# Patient Record
Sex: Male | Born: 1960 | Race: White | Hispanic: No | Marital: Married | Smoking: Former smoker
Health system: Southern US, Community
[De-identification: ages and names within clinical notes are randomized; demographics above are authoritative.]

## PROBLEM LIST (undated history)

## (undated) DIAGNOSIS — G8929 Other chronic pain: Secondary | ICD-10-CM

## (undated) DIAGNOSIS — I10 Essential (primary) hypertension: Secondary | ICD-10-CM

## (undated) DIAGNOSIS — C801 Malignant (primary) neoplasm, unspecified: Secondary | ICD-10-CM

## (undated) DIAGNOSIS — E3400 Carcinoid syndrome, unspecified: Secondary | ICD-10-CM

## (undated) DIAGNOSIS — E7800 Pure hypercholesterolemia, unspecified: Secondary | ICD-10-CM

## (undated) DIAGNOSIS — R079 Chest pain, unspecified: Secondary | ICD-10-CM

## (undated) HISTORY — PX: WEDGE RESECTION: SHX5070

## (undated) HISTORY — DX: Malignant (primary) neoplasm, unspecified: C80.1

## (undated) HISTORY — DX: Pure hypercholesterolemia, unspecified: E78.00

## (undated) HISTORY — PX: HIGH DOSE RADIATION IMPLANT INSERTION: SHX6258

## (undated) HISTORY — PX: CHOLECYSTECTOMY: SHX55

## (undated) HISTORY — PX: OTHER SURGICAL HISTORY: SHX169

## (undated) HISTORY — DX: Essential (primary) hypertension: I10

## (undated) HISTORY — DX: Other chronic pain: G89.29

---

## 1998-07-16 ENCOUNTER — Ambulatory Visit (HOSPITAL_COMMUNITY): Admission: RE | Admit: 1998-07-16 | Discharge: 1998-07-16 | Payer: Self-pay | Admitting: Internal Medicine

## 1998-07-16 ENCOUNTER — Encounter: Payer: Self-pay | Admitting: Internal Medicine

## 2005-12-11 ENCOUNTER — Encounter: Admission: RE | Admit: 2005-12-11 | Discharge: 2005-12-11 | Payer: Self-pay | Admitting: Internal Medicine

## 2006-05-26 ENCOUNTER — Ambulatory Visit: Payer: Self-pay | Admitting: Cardiology

## 2006-05-26 ENCOUNTER — Inpatient Hospital Stay (HOSPITAL_COMMUNITY): Admission: EM | Admit: 2006-05-26 | Discharge: 2006-05-27 | Payer: Self-pay | Admitting: Emergency Medicine

## 2006-05-27 ENCOUNTER — Encounter: Payer: Self-pay | Admitting: Cardiology

## 2006-07-29 ENCOUNTER — Encounter: Admission: RE | Admit: 2006-07-29 | Discharge: 2006-07-29 | Payer: Self-pay | Admitting: Internal Medicine

## 2006-08-03 DIAGNOSIS — C801 Malignant (primary) neoplasm, unspecified: Secondary | ICD-10-CM

## 2006-08-03 HISTORY — DX: Malignant (primary) neoplasm, unspecified: C80.1

## 2006-08-06 ENCOUNTER — Encounter: Admission: RE | Admit: 2006-08-06 | Discharge: 2006-08-06 | Payer: Self-pay | Admitting: Internal Medicine

## 2006-08-25 ENCOUNTER — Ambulatory Visit (HOSPITAL_COMMUNITY): Admission: RE | Admit: 2006-08-25 | Discharge: 2006-08-25 | Payer: Self-pay | Admitting: Gastroenterology

## 2006-08-25 ENCOUNTER — Encounter (INDEPENDENT_AMBULATORY_CARE_PROVIDER_SITE_OTHER): Payer: Self-pay | Admitting: Specialist

## 2006-08-27 ENCOUNTER — Encounter: Admission: RE | Admit: 2006-08-27 | Discharge: 2006-08-27 | Payer: Self-pay | Admitting: Gastroenterology

## 2006-08-29 ENCOUNTER — Encounter: Admission: RE | Admit: 2006-08-29 | Discharge: 2006-08-29 | Payer: Self-pay | Admitting: Gastroenterology

## 2006-08-31 ENCOUNTER — Ambulatory Visit: Payer: Self-pay | Admitting: Oncology

## 2006-09-02 LAB — CBC WITH DIFFERENTIAL/PLATELET
BASO%: 0.6 % (ref 0.0–2.0)
Basophils Absolute: 0.1 10*3/uL (ref 0.0–0.1)
Eosinophils Absolute: 0.2 10*3/uL (ref 0.0–0.5)
LYMPH%: 27.8 % (ref 14.0–48.0)
MCH: 28.8 pg (ref 28.0–33.4)
MCHC: 34.6 g/dL (ref 32.0–35.9)
MCV: 83.2 fL (ref 81.6–98.0)
MONO#: 1 10*3/uL — ABNORMAL HIGH (ref 0.1–0.9)
NEUT#: 6 10*3/uL (ref 1.5–6.5)
NEUT%: 59.9 % (ref 40.0–75.0)
Platelets: 222 10*3/uL (ref 145–400)
RBC: 4.84 10*6/uL (ref 4.20–5.71)
lymph#: 2.8 10*3/uL (ref 0.9–3.3)

## 2006-09-03 ENCOUNTER — Ambulatory Visit (HOSPITAL_COMMUNITY): Admission: RE | Admit: 2006-09-03 | Discharge: 2006-09-03 | Payer: Self-pay | Admitting: Gastroenterology

## 2006-09-06 LAB — COMPREHENSIVE METABOLIC PANEL
AST: 20 U/L (ref 0–37)
Albumin: 4.4 g/dL (ref 3.5–5.2)
Alkaline Phosphatase: 59 U/L (ref 39–117)
CO2: 30 mEq/L (ref 19–32)
Calcium: 9.2 mg/dL (ref 8.4–10.5)
Chloride: 99 mEq/L (ref 96–112)
Potassium: 4 mEq/L (ref 3.5–5.3)
Total Bilirubin: 0.5 mg/dL (ref 0.3–1.2)

## 2006-09-06 LAB — CHROMOGRANIN A: Chromogranin A: 108 ng/mL (ref <160)

## 2006-09-20 LAB — 5 HIAA, QUANTITATIVE, URINE, 24 HOUR

## 2006-10-26 ENCOUNTER — Ambulatory Visit: Payer: Self-pay | Admitting: Oncology

## 2006-11-05 ENCOUNTER — Encounter: Admission: RE | Admit: 2006-11-05 | Discharge: 2006-11-05 | Payer: Self-pay | Admitting: Oncology

## 2006-12-21 ENCOUNTER — Ambulatory Visit: Payer: Self-pay | Admitting: Oncology

## 2007-03-04 ENCOUNTER — Ambulatory Visit: Payer: Self-pay | Admitting: Oncology

## 2007-05-19 ENCOUNTER — Ambulatory Visit: Payer: Self-pay | Admitting: Oncology

## 2007-05-19 LAB — BASIC METABOLIC PANEL: Chloride: 103 mEq/L (ref 96–112)

## 2007-08-15 ENCOUNTER — Ambulatory Visit: Payer: Self-pay | Admitting: Oncology

## 2007-08-15 LAB — BASIC METABOLIC PANEL
BUN: 15 mg/dL (ref 6–23)
Calcium: 9.2 mg/dL (ref 8.4–10.5)
Creatinine, Ser: 1 mg/dL (ref 0.40–1.50)
Glucose, Bld: 262 mg/dL — ABNORMAL HIGH (ref 70–99)

## 2007-11-15 ENCOUNTER — Ambulatory Visit: Payer: Self-pay | Admitting: Oncology

## 2008-02-21 ENCOUNTER — Ambulatory Visit: Payer: Self-pay | Admitting: Oncology

## 2008-03-21 IMAGING — CT CT PELVIS W/ CM
4 of 5 series · 12 of 46 positions shown, 18 images · IV contrast (READICAT/WATER & [ID] OMNI 300)
Comparison: Prior abdomen CT 08/06/06 and chest CT of 05/26/06.

CLINICAL DATA: Carcinoid syndrome.  Followup liver lesions. 
CHEST CT WITH CONTRAST:
TECHNIQUE: Multidetector CT imaging of the chest was performed following the standard protocol during bolus administration of intravenous contrast.
Contrast:  125 cc Omnipaque 300
TECHNIQUE: Multidetector CT imaging of the abdomen was performed following the standard protocol during bolus administration of intravenous contrast.
TECHNIQUE: Multidetector CT imaging of the pelvis was performed following the standard protocol during bolus administration of intravenous contrast.

[Series 3: chest/abd/pelvis · axial · 0.86mm/px · z∈[-581,-276]mm · 5 of 129 slices shown]
[im 16/129  soft-tissue]
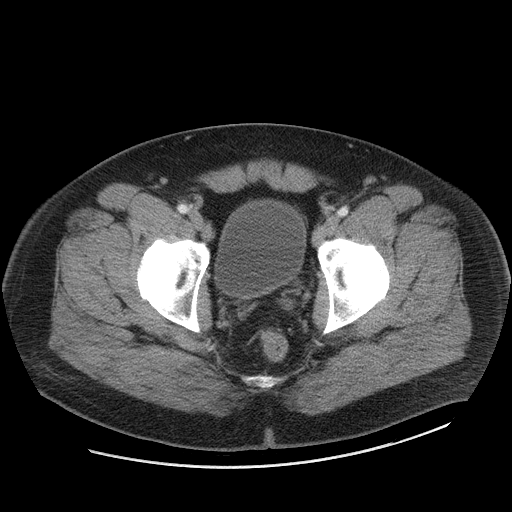
[im 31/129  soft-tissue]
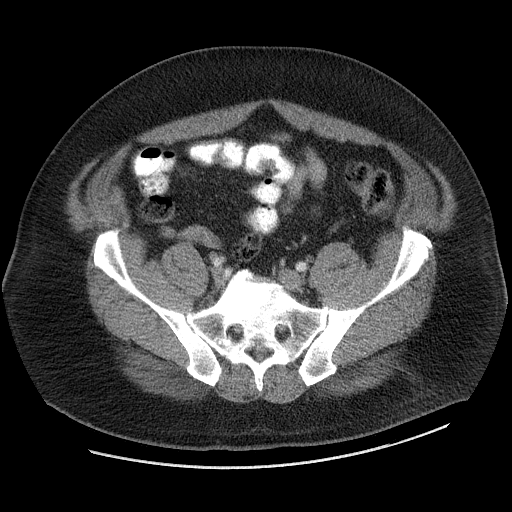
[im 46/129  soft-tissue]
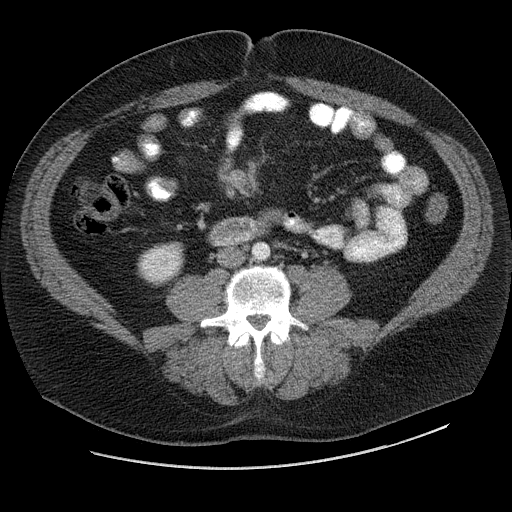
[im 61/129  soft-tissue]
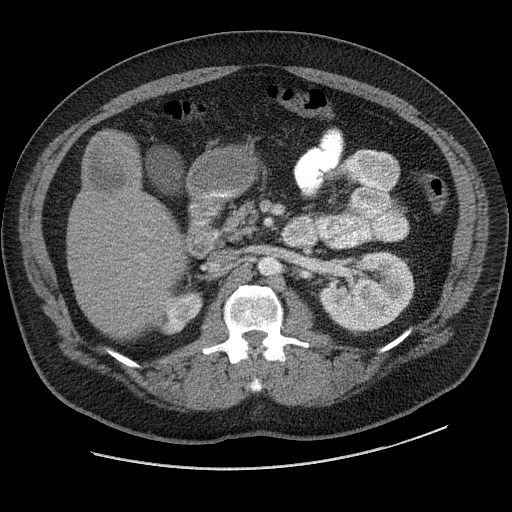
[im 76/129  soft-tissue]
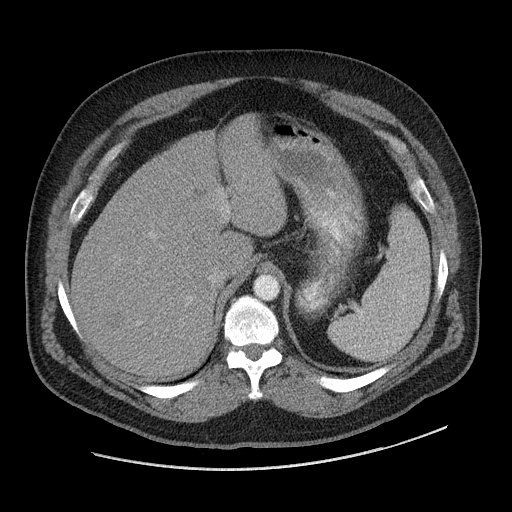

[Series 5: renal delay · axial · delayed · 0.86mm/px · z∈[-398,-318]mm · 3 of 33 slices shown, 7 images]
[im 9/33  soft-tissue]
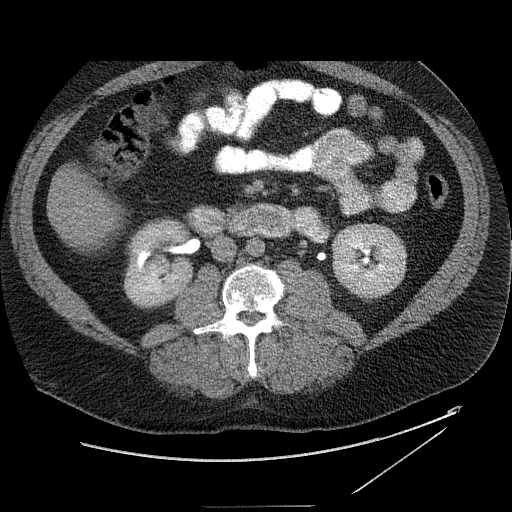
[im 9/33  lung]
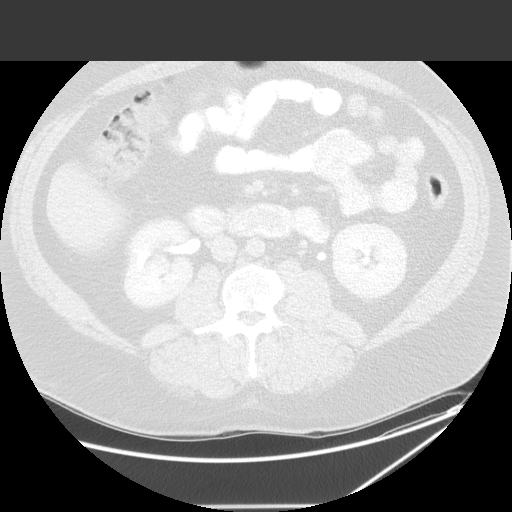
[im 9/33  bone]
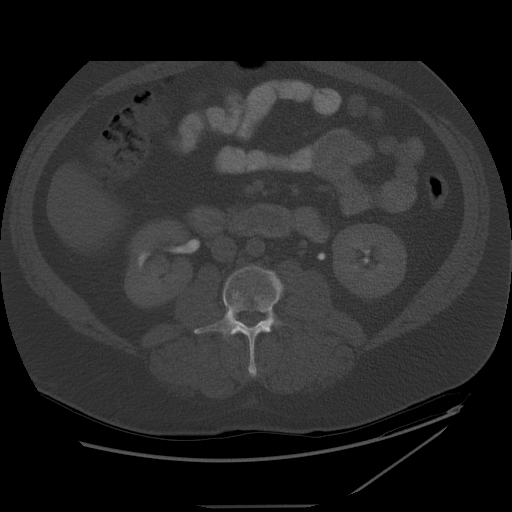
[im 17/33  soft-tissue]
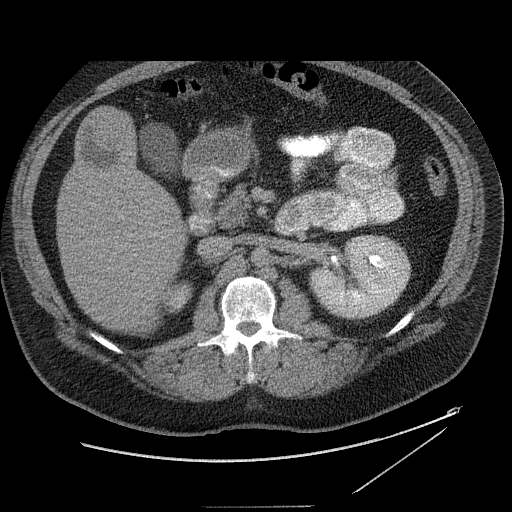
[im 17/33  lung]
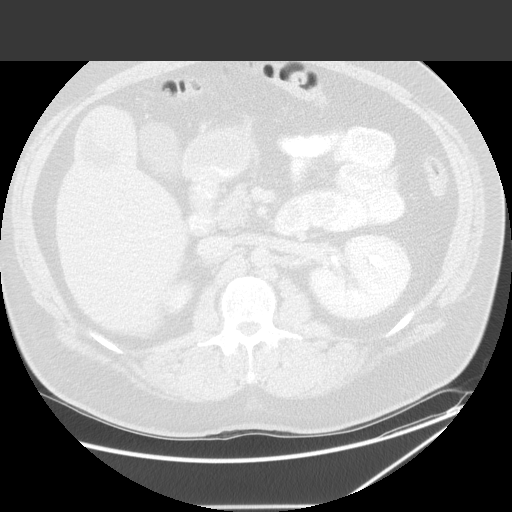
[im 25/33  soft-tissue]
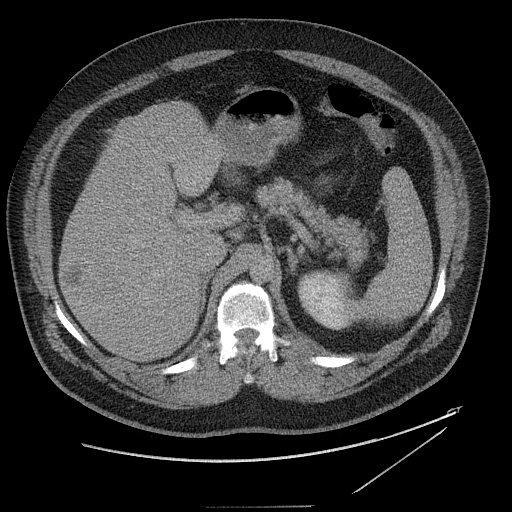
[im 25/33  lung]
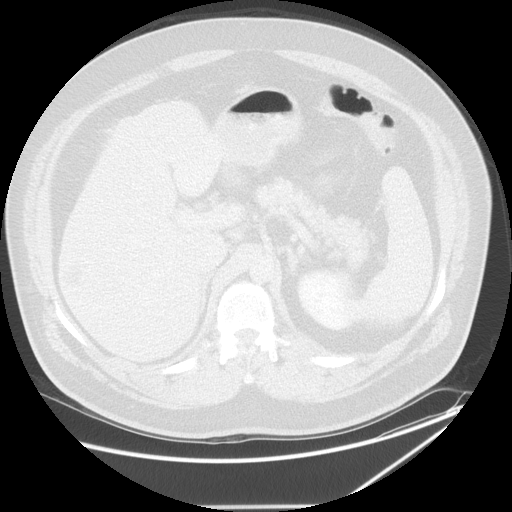

[Series 601: coronal body · coronal · 1.40mm/px · 1 of 149 slices shown, 2 images]
[im 50/149  soft-tissue]
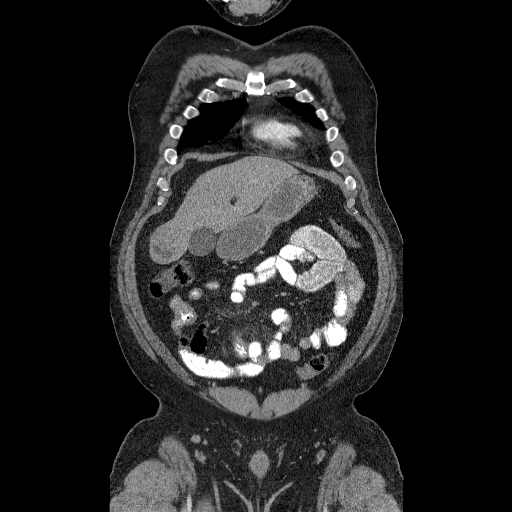
[im 50/149  bone]
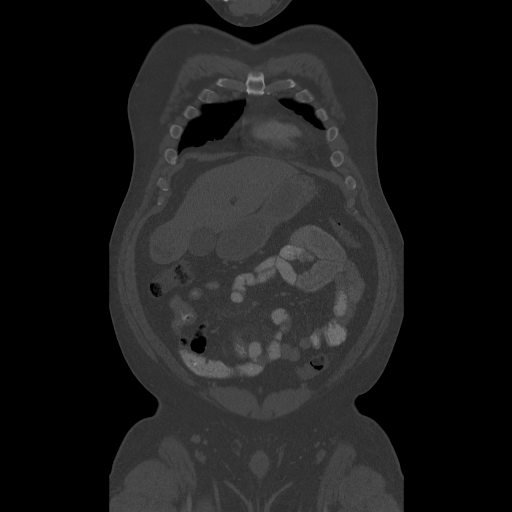

[Series 602: sagittal body · sagittal · 1.40mm/px · 3 of 177 slices shown, 4 images]
[im 59/177  soft-tissue]
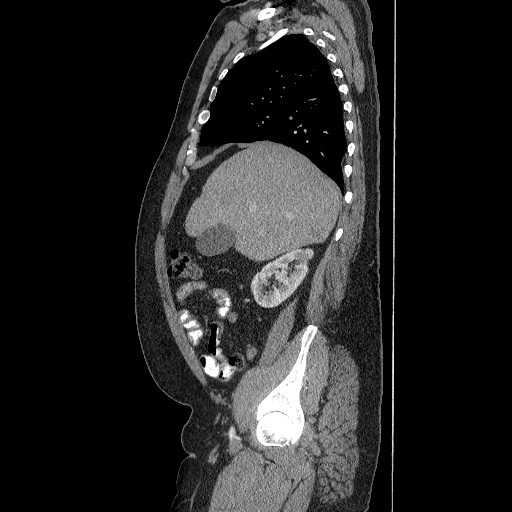
[im 79/177  soft-tissue]
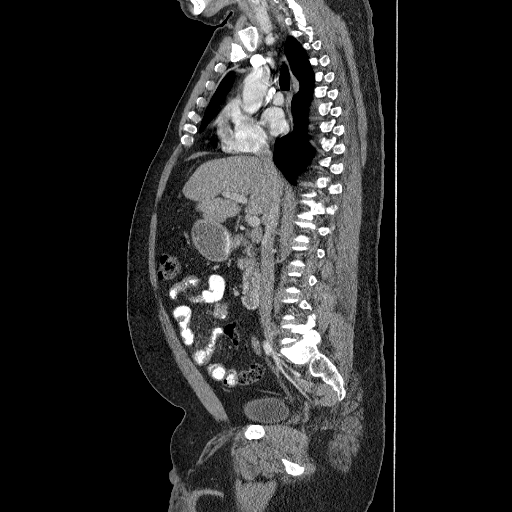
[im 79/177  bone]
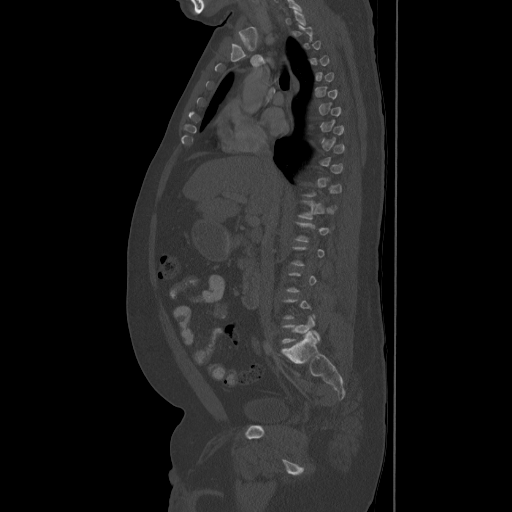
[im 98/177  soft-tissue]
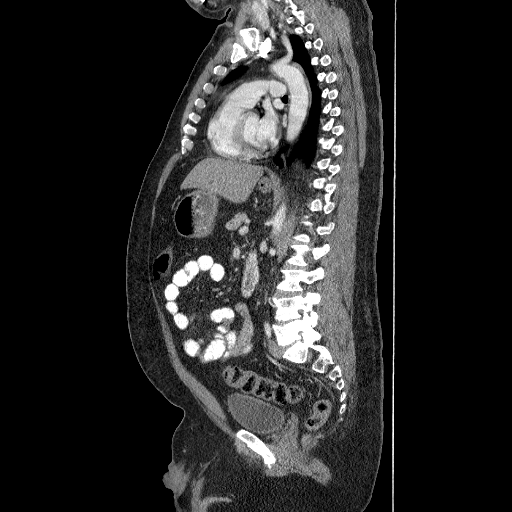

[12 of 46 positions shown; findings below may reference images not displayed]

FINDINGS: No new lung lesion is seen.  A calcified granuloma in the superior segment of left lower lobe is stable.  No effusion is seen.  A defect in the inferior end plate of T-6 vertebral body with surrounding sclerosis is unchanged compared to images from 05/26/06 and most likely benign.  No mediastinal or hilar adenopathy is seen.  The pulmonary arteries and thoracic aorta opacify with no acute abnormality.
IMPRESSION: Stable CT of the chest.  No metastatic involvement.  
ABDOMEN CT WITH CONTRAST:
FINDINGS: Compared to the CT abdomen of 08/06/06 several of the liver lesions may have increased in size.  The dominant lesion from the caudal aspect of the right lobe extending anteriorly has not changed measuring 53 x 69 mm.  However there does appear to be slight enlargement of a lesion in the posterior right lobe on image 66 measuring 20 x 19 mm measuring approximately 15 x 17 mm previously.  In addition, a lesion in the periphery of the right lobe on image #63 now measures 24 x 29 mm compared to 23 x 26 mm previously.  There are also several low attenuation lesions that are more prominent and possibly new,  suggestive of progressive liver metastases.  No ductal dilatation is seen.  No calcified gallstones are noted.  The pancreas, adrenal glands, and spleen are stable.  Hepatoduodenal nodes have not changed significantly.  Nodular mass in the midabdomen has remained relatively stable measuring 22 x 46 mm on image #88.  The abdominal aorta is normal in caliber.   The kidneys enhance normally and on delayed images the pelvocaliceal systems appear normal.
IMPRESSION: 1.  No change in lobular mass in the mid mesentery when compared to the study of 08/06/06. 
2.  Some of the liver lesions appear to be slightly more prominent with suggestion of new lesions worrisome for progression of liver metastases.  
PELVIS CT WITH CONTRAST:
FINDINGS: Urinary bladder is unremarkable.  Prostate is normal in size.  No colonic abnormality is seen.  The SI joints are somewhat irregular worrisome for sacroiliitis.  Apparent significant degenerative disc disease is noted at L5-S1.
IMPRESSION: 1.   No pelvic mass or adenopathy. 
2.  Significant degenerative disc disease at L5-S1.
3.  Question sacroiliitis.

## 2008-05-24 ENCOUNTER — Ambulatory Visit: Payer: Self-pay | Admitting: Oncology

## 2008-11-09 ENCOUNTER — Ambulatory Visit: Payer: Self-pay | Admitting: Oncology

## 2009-03-08 ENCOUNTER — Ambulatory Visit: Payer: Self-pay | Admitting: Oncology

## 2009-04-01 ENCOUNTER — Ambulatory Visit (HOSPITAL_BASED_OUTPATIENT_CLINIC_OR_DEPARTMENT_OTHER): Admission: RE | Admit: 2009-04-01 | Discharge: 2009-04-01 | Payer: Self-pay | Admitting: Urology

## 2009-09-18 ENCOUNTER — Ambulatory Visit: Payer: Self-pay | Admitting: Oncology

## 2010-01-14 ENCOUNTER — Ambulatory Visit: Payer: Self-pay | Admitting: Oncology

## 2010-04-15 ENCOUNTER — Ambulatory Visit: Payer: Self-pay | Admitting: Oncology

## 2010-07-23 ENCOUNTER — Ambulatory Visit: Payer: Self-pay | Admitting: Oncology

## 2010-08-24 ENCOUNTER — Encounter: Payer: Self-pay | Admitting: Oncology

## 2010-11-08 LAB — POCT I-STAT 4, (NA,K, GLUC, HGB,HCT)
Glucose, Bld: 164 mg/dL — ABNORMAL HIGH (ref 70–99)
Potassium: 4.1 mEq/L (ref 3.5–5.1)

## 2010-11-08 LAB — GLUCOSE, CAPILLARY: Glucose-Capillary: 145 mg/dL — ABNORMAL HIGH (ref 70–99)

## 2010-12-16 NOTE — Op Note (Signed)
NAME:  Frank Pennington, PAGLIA NO.:  [id number]   MEDICAL RECORD NO.:  [id number]          PATIENT TYPE:  AMB   LOCATION:  [facility]                         FACILITY:  [facility]   PHYSICIAN:  Sigmund I. Patsi Sears, M.D.DATE OF BIRTH:  06/21/1961   DATE OF PROCEDURE:  04/01/2009  DATE OF DISCHARGE:                               OPERATIVE REPORT   PREOPERATIVE DIAGNOSIS:  Distal urethral stricture.   POSTOPERATIVE DIAGNOSIS:  Distal urethral stricture.   OPERATION:  Cystourethroscopy, Cook balloon dilation of distal pendulous  urethral stricture.   SURGEON:  Sigmund I. Patsi Sears, M.D.   ANESTHESIA:  General LMA.   PREPARATION:  After appropriate preanesthesia, the patient is brought to  the operating room, placed on the operating table in dorsal supine  position where general LMA anesthesia was introduced.  He was then  replaced on dorsal lithotomy position where pubis was prepped with  Betadine solution and draped in usual fashion.   BRIEF HISTORY:  This 50 year old male has a history of malignant  carcinoid, metastatic to the liver, lymph nodes and lung.  The patient  has hematuria, was referred for evaluation where cystoscopy revealed a  dense distal urethral stricture, beginning in the submeatal area, and  traveling for unknown proximal distance.  The flexible cystoscope could  not be passed around the urethra.  The patient appeared to have a  secondary urethral passage, it is felt that he probably had a prior  instrumentation with epithelization of alternate urethral tract.   PROCEDURE:  Cystourethroscopy was accomplished, and submeatally, a dense  urethral stricture is identified.  Retrograde urethrogram shows a  stricture, and a guidewire was passed through the strictured area, and  manipulated with some difficulty through the urethra, into the bladder.  Urethral stricture appears to be approximately 3 cm long.  The Cook  balloon dilator was then passed  fluoroscopically through the urethra, to  the level of the prostatic urethra and bladder neck, and the entire  urethra is balloon dilated, with particular attention to the distal  urethral stricture.  Following 3-minute urethral dilation to 18  atmospheres pressure, repeat cystoscopy was accomplished and shows  hyperemic urethra, but otherwise normal urethra.  The tissue was intact.  The prostate has bilobar hypertrophy.  The  bladder itself shows normal trigone, with clear reflux of both orifices.  There was no evidence of bladder stone, tumor, or bladder diverticular  formation.  The urethral stricture was completely dilated.  The patient  is awakened, taken to recovery room in good condition.      Sigmund I. Patsi Sears, M.D.  Electronically Signed     SIT/MEDQ  D:  04/01/2009  T:  04/01/2009  Job:  [id number]   cc:   Merlene Laughter. Renae Gloss, M.D.  Fax: [phone]

## 2010-12-19 NOTE — Discharge Summary (Signed)
NAME:  Frank Pennington, Frank Pennington NO.:  [id number]   MEDICAL RECORD NO.:  [id number]          PATIENT TYPE:  INP   LOCATION:  3707                         FACILITY:  [facility]   PHYSICIAN:  Lonia Blood, M.D.       DATE OF BIRTH:  April 07, 1961   DATE OF ADMISSION:  05/26/2006  DATE OF DISCHARGE:  05/27/2006                                 DISCHARGE SUMMARY   DISCHARGE DIAGNOSES:  1. Chest pain, noncardiac in nature, resolved.  2. Gastroesophageal reflux disease.  3. Obesity.  4. Impaired glucose tolerance.  5. Mild hypertension.  6. Hyperlipidemia.  7. Tobacco abuse.  8. Incidentally noted low-density lesion on the liver; follow-up CT scan      recommended in 3 months.   DISCHARGE MEDICATIONS:  1. Lisinopril 10 mg daily.  2. Lipitor 20 mg at bedtime.  3. Prilosec OTC 20 mg daily.  4. Chantix 1 mg twice a day.  5. Hydrochlorothiazide 12.5 mg daily.   CONDITION ON DISCHARGE:  Frank Pennington was discharged in good condition.  At  time of discharge, he was instructed follow up with Dr. Diona Browner with  [facility] cardiology and to follow up with Dr. Barbee Shropshire for a followup CAT  scan of the liver.  At time of discharge, the patient was encouraged to  abstain from smoking cigarettes.   PROCEDURES DURING THIS ADMISSION:  1. On May 27, 2006, the patient underwent a cardiac catheterization by      Dr. Excell Seltzer which showed normal coronary arteries.  2. May 27, 2006, transthoracic echocardiogram that showed a normal      ejection fraction of 55 to 60% and no left ventricular regional wall      motion abnormalities, mild LVH.  3. May 26, 2006, computer tomography of the chest with intravenous      contrast that was negative for PE and a low-density lesion in the liver      was seen.  Follow-up CT scan of the liver recommended.   HISTORY AND PHYSICAL:  For admission history and physical, refer to the  dictated H&P done by Dr. Eda Paschal on May 26, 2006.   HOSPITAL COURSE.:  1. Chest pain.  Frank Pennington was admitted on the day of May 26, 2006      with complaints of chest pain which were a concern for typical angina.      The patient was admitted to telemetry unit and placed on metoprolol and      aspirin.  He also had cardiac enzymes checked three times.  He ruled      out for myocardial infarction, and he indeed did not have any      recurrence of his chest pain.  For completeness and given also the fact      the patient had tachycardia and shortness of breath at the time of      onset of chest pain, he had a computer tomography of the chest which      was negative for PE.  The patient was seen in consultation by the      cardiology service  and had a cardiac catheterization done which was      negative for any coronary artery disease.  2. Impaired glucose tolerance.  Frank Pennington fasting plasma glucose was      found to be elevated with levels of 107.  The patient had a hemoglobin      A1c of 6.5, and at this point in time, he is deemed to be having      impaired glucose tolerance.  The patient was counseled about a low-      concentrated carbohydrate diet and avoiding concentrated sweets.      Further follow-up with fasting plasma glucose to be done by the      patient's primary care physician.  3. Incidental finding of possible liver lesion.  The patient was informed      about the incidental finding.  At this point in time, liver profile was      obtained and was all within normal limits.  The significance of the      liver finding is unclear at this point in time, but recommended CT scan      of the abdomen with intravenous contrast was made by radiologist.  We      informed the patient about the finding on the liver, and Dr. Barbee Shropshire      can reschedule a CT scan with intravenous contrast of the liver for      this patient.  Also, the patient was encouraged to have a colonoscopy      which his primary care physician has strongly encouraged him to  do in      the past.  Of note, the patient did not have any degree of anemia      during this hospitalization with a measured hemoglobin of 14.2.      Lonia Blood, M.D.  Electronically Signed     SL/MEDQ  D:  05/27/2006  T:  05/29/2006  Job:  [id number]   cc:   Olene Craven, M.D.  Jonelle Sidle, MD

## 2010-12-19 NOTE — Cardiovascular Report (Signed)
NAME:  RIDGELY, ANASTACIO NO.:  [id number]   MEDICAL RECORD NO.:  [id number]          PATIENT TYPE:  INP   LOCATION:  3707                         FACILITY:  [facility]   PHYSICIAN:  Veverly Fells. Excell Seltzer, MD  DATE OF BIRTH:  06-14-1961   DATE OF PROCEDURE:  05/27/2006  DATE OF DISCHARGE:  05/27/2006                              CARDIAC CATHETERIZATION   PROCEDURE:  Left heart catheterization, selective coronary angiography, left  ventricular angiography, and Angio-Seal to the right femoral artery.   INDICATION:  Mr. Bogden is a very nice 50 year old gentleman who presented  to the emergency department with substernal chest pressure, worse with  exertion.  He has a history of tobacco abuse, dyslipidemia, and a very  strong family history of coronary artery disease.  With his suggestive  symptoms and cardiovascular risk factors, we elected to proceed with cardiac  catheterization.   PROCEDURAL DETAILS:  Risks and indications of the procedure were explained  in detail to the patient.  Informed consent was obtained.  The right groin  was prepped, draped, and anesthetized with 1% lidocaine under normal sterile  conditions.  Using the modified Seldinger technique, a 6-French arterial  sheath was placed in the right femoral artery.  Multiple angiographic views  of the left and right coronary arteries were taken.  For the left coronary  artery, a 6-french, JL-4 catheter was used.  For the right coronary artery,  a 6-french, JR-4 catheter was used.  Following selective coronary  angiography, a pigtail catheter was inserted into the left ventricle.  Left  ventricular pressures were recorded.  A 30-degree RAO left ventriculogram  was performed.  Pull back across the aortic valve was done.  All catheter  exchanges were performed over a guidewire.  At the conclusion of the case, a  6-French Angio-Seal was used to seal the arteriotomy site in the right  femoral artery.   FINDINGS:   Aortic pressure 115/79 with a mean of 96.  Left ventricular  pressure 114/16 with an end diastolic pressure of 21.   Coronary angiography:  The left main stem is angiographically normal.  It  bifurcates into the LAD and left circumflex.  The LAD is medium-caliber.  It  courses down to the distal anterior wall, but does not wrap around the apex.  It is a very small vessel in its most distal segment.  It gives off 2  diagonal branches, they are both angiographically normal.   The left circumflex is medium caliber vessel that gives off a very small  first obtuse marginal and a large second obtuse marginal that has multiple  branches.  The true left circumflex courses down the AV groove and is small  diameter.  The left circumflex system has no significant angiographic  disease.   The right coronary artery is dominant.  It is large caliber.  Distally, it  bifurcates into the PDA and posterior AV segment.  The PDA is a very large  vessel that extends all the way to the left ventricular apex.  The posterior  AV segment gives off 3 posterolateral branches.  One  of which is medium  caliber and the other 2 are small.  There is no significant coronary artery  disease in the right coronary artery distribution.   The left ventriculogram was performed on the 30 degree RAO projection.  The  left ventricular systolic function is borderline.  There is subtle global  hypokinesis with an estimated overall left ventricular ejection fraction of  50%.   ASSESSMENT:  1. Normal coronary arteries.  2. Borderline left ventricular function.   PLAN:  Mr. Chohan has been pain free and his coronary arteries are normal.  He should be okay for discharge home later today.  I suspect his chest pain  was noncardiac in origin.  To follow up his borderline left ventricular  function, we will perform an echocardiogram in approximately 2 weeks and  have him follow up with Dr. Diona Browner in the clinic in 2 to 4 weeks.   We will  not start him on any medical therapy at this time.      Veverly Fells. Excell Seltzer, MD  Electronically Signed     MDC/MEDQ  D:  05/27/2006  T:  05/28/2006  Job:  [id number]   cc:   Jonelle Sidle, MD  Olene Craven, M.D.

## 2010-12-19 NOTE — H&P (Signed)
NAME:  LOREN, SAWAYA               ACCOUNT NO.:  [id number]   MEDICAL RECORD NO.:  [id number]          PATIENT TYPE:  INP   LOCATION:  3707                         FACILITY:  [facility]   PHYSICIAN:  Hind I Elsaid, MD      DATE OF BIRTH:  1961-05-12   DATE OF ADMISSION:  05/26/2006  DATE OF DISCHARGE:                                HISTORY & PHYSICAL   MAIN COMPLAINT:  Chest pain for 1 day.   HISTORY OF PRESENT ILLNESS:  Mr. Sabedra is 50 gentleman, with a strong  family history of MI, history of chronic smoking, presented to the emergency  room around 2:00 a.m..  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  Chest pain, he admitted that the chest pain around 5, the severity  of pain 5/10, is a dull pain radiating to the shoulder and arm.  And  associated with sweating and nausea and shortness of breath.  Patient take a  little rest, he sat down and rested but the pain continued.  Also, the pain  continued the same down, did not resolve.  The severity of pain around  midnight, when he had to come to the emergency room.  According to the  patient, the pain responded to a nitroglycerin sublingual and is much better  now with a nitro drip.  Now, on interviewing the patient, the pain he  admitted is around 3 out of 10 and also localized with shoulder pain.  He  denies any shortness of breath at this moment and he denies any nausea.   PAST MEDICAL HISTORY:  History of hypertension, history of gastroesophageal  reflux disease, and hypercholesterolemia.   MEDICATIONS:  Home medication, he is on:  1. Lisinopril/HCTZ 1 tab p.o. daily.  2. Lipitor 40 mg p.o. q.h.s.  3. Chantix for quit smoking 1 mg p.o. b.i.d.   ALLERGIES:  No known drug allergies.   FAMILY HISTORY:  Is positive for MI on his dad's side.  His dad died at age  80.   History of COPD and hypertension on the mother's side.   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  He is in process of quitting smoking, now he is on 2 cigarettes  per day.  He started trying to quit smoking around 2 weeks ago.  He used to  smoke up to 2 packs per day for 20 years.  And he has no history of  alcoholism.  History of cocaine abuse.  He stopped like 9 months ago.   PHYSICAL EXAMINATION:  On examination of Mr. Herbers, vital signs: 50  97.9,  blood pressure 131/80, heart rate 81, respiratory rate 20, pulse oximetry  95% on room air.  GENERAL:  On general examination, Mr. Trebilcock is a moderately obese male  with normocephalic, atraumatic.  Pupil equally reactive to light and  accommodation.  No JVD and no thyromegaly.  No lymphadenopathy appreciated.  No peripheral cyanosis on his mouth.  The mucosa is moist, not dry.  His  mucosa is moist, tonsil dry, mucosa is moist and neck is short.  CARDIOVASCULAR EXAMINATION.  HEENT:  Is normocephalic, atraumatic.  Eyes, as I mentioned before.  NECK EXAMINATION:  Is short, no JVD and no thyromegaly.  CHEST EXAMINATION:  There is not any point of tenderness on pressing, that  means the pain is not producible by compression.  Patient can move his  shoulder, there is not any sign of shoulder, any rotator cuff tear or any  tendinitis.  EXAMINATION OF THE HEART:  Patient has S1 and S2.  I did not appreciate no  murmur, no gallops, and no rubs.  EXAMINATION OF THE LUNG:  Normal vesicular breathing.  Equal air entry and  breath sound is normal.  ABDOMINAL EXAMINATION:  Soft, non tender, bowel sound is normal.  No  organomegaly.  Symmetry of peripheral pulses intact.  Peripheral pulse  intact and there is no cyanosis, no peripheral cyanosis and there is no  ulcers.   LABORATORY DATA:  Patient on admission, sodium 141,  potassium 3.7, chloride  105, bi carb 28.5, chloride 16, BUN 16, creatinine 1.1, and glucose 118,  white blood cells 10.9, hemoglobin 14.9, hematocrit 43.6, and platelets 209.  Anion gap is 8, myoglobin is 54.8, CK MB 1.1, and troponin is negative x1.  Chest x-ray:  There is no active pulmonary disease and CT angiogram was done  to rule out PE, which there is no evidence of PE and there is bibasilar  atelectasis and there is 3 low density small density within the liver, the  largest size is 5.3 with mixed isodense and hyperdense exophytic lesion on  the anterior aspect of the right hepatic lobe about 5.3 cm.  EKG in the ER  was normal sinus rhythm at 78 beats per minute.   ASSESSMENT/PLAN:  1. Chest pain with shortness of breath.  Patient is a high risk patient      with a history of chronic smoking and positive family history of      coronary artery disease.  We can not rule out acute coronary syndrome      or coronary spasm.  Also, we can not rule out musculoskeletal pain,      although, examinations there is not any sign of rotator cuff tear or      any sign of any nerve compression.  Will admit the patient to tele for      close observation.  Will start the patient on aspirin 325 mg,      metoprolol 25 mg p.o. b.i.d.  CK MB will run a CK MB and troponin q.8      hours 24 hours.  Will order echo.  Cardiologist was consulted to clear      the patient to make sure there is no CHF here.  Will continue with      nitroglycerin 0.4 mg subcu p.r.n. for pain.  At the same, will continue      with a nitroglycerin drip as the patient admitted is helpful.      Morphine, also morphine 2 mg IV q.6 hours p.r.n. for pain.  2. CT finding of some liver exophytic masses around 5.3 cm.  It is      possible it could be a cyst or some masses.  We  may need to be able to      see this on ultrasound of the liver.  Will get ultrasound of the liver     after chest pain resolved.  Will get LFT's and alpha  fetoprotein  for      the time being.  3. Hypertension.  Will continue with Lisinopril and Metoprolol 25 mg p.o.      q.12.  4. Hyperlipidemia.  Will continue with Lipitor.  Will get lipid profile,      hemoglobin C1 and TSH and Lipitor 40 mg p.o. q.h.s.  5. And for his history of GERD.  Protonix 40 mg p.o. daily.  6. DVT and GI prophylaxis.      Hind Bosie Helper, MD  Electronically Signed     HIE/MEDQ  D:  05/26/2006  T:  05/27/2006  Job:  [id number]

## 2010-12-19 NOTE — Consult Note (Signed)
NAME:  Frank Pennington, Frank Pennington NO.:  [id number]   MEDICAL RECORD NO.:  [id number]          PATIENT TYPE:  INP   LOCATION:  3707                         FACILITY:  [facility]   PHYSICIAN:  Jonelle Sidle, MD DATE OF BIRTH:  [birth year]   DATE OF CONSULTATION:  05/26/2006  DATE OF DISCHARGE:                                   CONSULTATION   HISTORY OF PRESENT ILLNESS:  Frank Pennington is a 50 year old, white male who  presents to [facility] Emergency Room complaining of chest discomfort.  Initially evaluated and treated by [facility] who is admitting for further  evaluation.  We are asked to see secondary to chest discomfort.   Frank Pennington describes over the preceding several days a profound decrease  in energy and general fatigue.  Yesterday afternoon at approximately 4 p.m.  while changing his wife's tire, he first noticed some shortness of breath  followed by nausea, profound fatigue like he had been climbing a mountain  and diaphoresis.  He went inside and sat down to rest after he finished  changing the tire.  At that point, he developed left-sided anterior chest  dull pain radiating into his left arm.  He gave it a 5 on a scale of 0/10.  He did take some Alka-Seltzer which decreased the nausea, but did not  improve his other symptoms.  After relaxing, showering and attending a  meeting, during which time his discomfort maintained a 3 on a scale of 0/10  and his other symptoms had dissipated.  He went to bed around 10:30 p.m.  He  was unable to rest and was able to dose for about an hour and half after  taking some more Alka-Seltzer.  However, due to continuing symptoms, he  drove to the emergency room due to the persistence.  He denies prior  occurrences.  He feels the discomfort is worse at end expiration.  He denies  changes with movement, tenderness or changes with exertion.  Now at the time  of our evaluation, he states it is a 2-3 on a scale of 0/10.  The last time  it  was a 0 was before onset at 1600 hours yesterday.   ALLERGIES:  No known drug allergies.   MEDICATIONS PRIOR TO ADMISSION:  1. Chantix 1 mg b.i.d.  2. Lipitor 20 mg daily.  3. Lisinopril/hydrochlorothiazide 10/12.5 mg daily.   PAST MEDICAL HISTORY:  1. Hypertension.  He rarely checks his blood pressure at home.  However,      when he does, it ranges between 100-130s systolic and 60-80s diastolic.  2. History of hyperlipidemia.  Last time it was checked was approximately      6 months ago.  He does not know his numbers.  Lipitor was started      approximately a year ago.  3. GERD.  4. He was evaluated in [place] for palpitations approximately 10 years      ago.  At that time, a stress test was performed and he was told it was      okay.   PAST SURGICAL HISTORY:  He  denies any surgical history.   SOCIAL HISTORY:  He resides in [place] with his wife.  He has one biological  child and no grandchildren.  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  He  smoked one and a half packs a day x20 years.  He is down to two to three  cigarettes per day and denies any alcohol.  He has not used cocaine in 9  months after attending a recovery program at [facility] from August 19, 2005, to September 16, 2005.  He does not exercise.  He tries to avoid  fried foods and sodas.  Denies herbal medications.   FAMILY HISTORY:  Mother is alive in her 23s with a history of COPD, CHF,  hypertension and hypothyroidism.  Father died at the age of 80 during  anesthesia for cataract surgery.  His father had his first myocardial  infarction in his 25s and was status bypass surgery.  His wife seems to  think his ejection fraction was approximately 8%.  He has a brother and  sister that are alive and well.   REVIEW OF SYSTEMS:  CONSTITUTIONAL:  Notable for 40 pound weight gain over  the last 9 months, occasional sinus congestion, improved smoker's cough,  positive snoring, however, wife denies OSA.   GENITOURINARY:  Urinary  frequency and urgency which he was going to discuss with Dr. Barbee Shropshire at his  next checkup.  Bright red per rectum occasionally secondary to hemorrhoids.  He denies any history of diabetes, myocardial infarction, CVA, COPD,  bleeding, dyscrasia or thyroid dysfunction.   CURRENT MEDICATIONS:  1. Metoprolol 25 mg b.i.d.  2. Lisinopril 20 b.i.d.  3. Lipitor 40 nightly.  4. Sublingual nitroglycerin p.r.n.   PHYSICAL EXAMINATION:  VITAL SIGNS:  Temperature is 97.9, blood pressure  131/80, now 108/71, pulse 81, respirations 20, 95% saturations on room air.  HEENT:  Grossly unremarkable.  NECK:  Supple without thyromegaly, adenopathy, JVD or carotid bruits.  CHEST:  Symmetrical excursion.  LUNGS:  Sounds were essentially clear, but decreased breath sounds with some  inspiratory wheezing.  HEART:  PMI is not displaced.  Regular rate and rhythm.  I did not  appreciate any murmurs, rubs, clicks or gallops.  All pulses are symmetrical  and intact without abdominal or femoral bruits.  SKIN:  Integument intact.  ABDOMEN:  Obese.  Bowel sounds present without organomegaly, masses or  tenderness.  EXTREMITIES:  No cyanosis, clubbing or edema.  MUSCULOSKELETAL:  Unremarkable.  He does have some chest wall tenderness,  but this is not reproducible of his symptoms.  NEUROLOGIC:  Grossly  unremarkable.   LABORATORY DATA AND X-RAY FINDINGS:  Chest x-ray with no active disease.  Chest CT was negative for pulmonary embolism, bibasilar atelectasis.  He had  several low-density liver lesions with recommend followup CT.  EKG shows  normal sinus rhythm, normal intervals with a ventricular rate of 78, normal  axis, no acute changes.  Old EKGs are not available for comparison.  H&H is  14.9 an 43.6, normal indices, platelets 209, WBCs 10.9.  Sodium 141,  potassium 3.7, glucose 118, BUN 16, D-dimer was elevated at 0.48.  Point of care markers were within normal limits x3.    IMPRESSION:  1. Prolonged chest discomfort since 1600 hours yesterday of uncertain      etiology associated with a normal EKG and point of care markers.      Elevated D-dimer with a negative chest CT for pulmonary embolism.  2.  Hypertension.  3. Obesity.  4. Tobacco use, but is in the process of quitting.  5. Remote cocaine use.  6. Obesity.  7. Incidentally noted low-density liver lesions by CT scan.  Follow-up      scanning suggested (see radiology report).  I would anticipate that the      primary admitting service relay this information and follow-up plans to      the patient's primary care physician.   RECOMMENDATIONS:  Dr. Diona Browner reviewed the patient's history, spoke with  and examined the patient and agrees with the above.  We will continue his  current medications, although we will add an aspirin to his medical regimen  and place him on IV heparin for now.  We will also check CK total, MBs, and  troponins q.8 h. x3 as well as obtaining a PT/PTT, hemoglobin A1c given his  high glucose.  We will also check a CMET.  Dr. Diona Browner discussed further  diagnostic options with the patient (noninvasive versus invasive) and  reviewed the potential risks and benefits of stress Myoview and of cardiac  catheterization.  After discussing this, the patient agrees to proceed with  a cardiac catheterization.  Will arrange.  We will keep the patient n.p.o.  for now in case he can proceed today.  Further treatment plan pending.     ______________________________  Joellyn Rued, PA-C      Jonelle Sidle, MD  Electronically Signed    EW/MEDQ  D:  05/26/2006  T:  05/27/2006  Job:  [id number]   cc:   Olene Craven, M.D.

## 2011-01-23 ENCOUNTER — Encounter (HOSPITAL_BASED_OUTPATIENT_CLINIC_OR_DEPARTMENT_OTHER): Payer: BC Managed Care – PPO | Admitting: Oncology

## 2011-01-23 DIAGNOSIS — E340 Carcinoid syndrome: Secondary | ICD-10-CM

## 2011-01-23 DIAGNOSIS — C787 Secondary malignant neoplasm of liver and intrahepatic bile duct: Secondary | ICD-10-CM

## 2011-01-23 DIAGNOSIS — C969 Malignant neoplasm of lymphoid, hematopoietic and related tissue, unspecified: Secondary | ICD-10-CM

## 2011-06-29 ENCOUNTER — Other Ambulatory Visit: Payer: Self-pay | Admitting: *Deleted

## 2011-06-29 ENCOUNTER — Encounter: Payer: Self-pay | Admitting: *Deleted

## 2011-06-29 DIAGNOSIS — E340 Carcinoid syndrome: Secondary | ICD-10-CM

## 2011-06-29 MED ORDER — OXYCODONE HCL 5 MG PO TABS: 5.0000 mg | ORAL_TABLET | ORAL | Status: DC | PRN

## 2011-06-29 MED ORDER — MORPHINE SULFATE CR 30 MG PO TB12: 30.0000 mg | ORAL_TABLET | Freq: Two times a day (BID) | ORAL | Status: DC

## 2011-06-29 NOTE — Telephone Encounter (Signed)
Left VM needing to pick up refills on pain meds.

## 2011-06-29 NOTE — Telephone Encounter (Signed)
Request to pick up scripts for pain meds

## 2011-08-13 ENCOUNTER — Other Ambulatory Visit: Payer: Self-pay | Admitting: *Deleted

## 2011-08-13 DIAGNOSIS — E340 Carcinoid syndrome: Secondary | ICD-10-CM

## 2011-08-13 MED ORDER — MORPHINE SULFATE CR 30 MG PO TB12: 30.0000 mg | ORAL_TABLET | Freq: Two times a day (BID) | ORAL | Status: DC

## 2011-08-13 MED ORDER — OXYCODONE HCL 5 MG PO TABS: 5.0000 mg | ORAL_TABLET | ORAL | Status: DC | PRN

## 2011-08-13 NOTE — Telephone Encounter (Signed)
Call from pt requesting refill on pain medications. Reviewed with Dr. Truett Perna. Rx left at front desk.

## 2011-09-10 ENCOUNTER — Other Ambulatory Visit: Payer: Self-pay | Admitting: *Deleted

## 2011-09-10 ENCOUNTER — Telehealth: Payer: Self-pay | Admitting: *Deleted

## 2011-09-10 ENCOUNTER — Telehealth: Payer: Self-pay | Admitting: Oncology

## 2011-09-10 NOTE — Telephone Encounter (Signed)
called pt lmovm for appt on 02/19. asked pt to rtn call to confirm

## 2011-09-10 NOTE — Telephone Encounter (Signed)
Message from pt requesting follow up appt with Dr. Truett Perna. Returned call, left message on voicemail for pt to call office and leave RN a message as to whether or not he is having an issue currently.

## 2011-09-12 ENCOUNTER — Telehealth: Payer: Self-pay | Admitting: Oncology

## 2011-09-12 NOTE — Telephone Encounter (Signed)
pt rtn call to confirm appt for 02/19

## 2011-09-21 ENCOUNTER — Other Ambulatory Visit: Payer: Self-pay | Admitting: *Deleted

## 2011-09-21 NOTE — Telephone Encounter (Signed)
Called patient to follow up on his refill request. Reminded him he has appointment with MD tomorrow. He will get refills at that time.

## 2011-09-21 NOTE — Telephone Encounter (Signed)
Wife called for pain med refills for patient.

## 2011-09-22 ENCOUNTER — Encounter: Payer: Self-pay | Admitting: Oncology

## 2011-09-22 ENCOUNTER — Other Ambulatory Visit: Payer: Self-pay | Admitting: *Deleted

## 2011-09-22 ENCOUNTER — Ambulatory Visit (HOSPITAL_BASED_OUTPATIENT_CLINIC_OR_DEPARTMENT_OTHER): Payer: BC Managed Care – PPO | Admitting: Oncology

## 2011-09-22 VITALS — BP 139/96 | HR 90 | Temp 98.4°F | Ht 71.5 in | Wt 260.7 lb

## 2011-09-22 DIAGNOSIS — E340 Carcinoid syndrome: Secondary | ICD-10-CM

## 2011-09-22 DIAGNOSIS — D3A00 Benign carcinoid tumor of unspecified site: Secondary | ICD-10-CM

## 2011-09-22 DIAGNOSIS — C787 Secondary malignant neoplasm of liver and intrahepatic bile duct: Secondary | ICD-10-CM

## 2011-09-22 DIAGNOSIS — Z23 Encounter for immunization: Secondary | ICD-10-CM

## 2011-09-22 MED ORDER — OXYCODONE HCL 5 MG PO TABS: 5.0000 mg | ORAL_TABLET | ORAL | Status: DC | PRN

## 2011-09-22 MED ORDER — MORPHINE SULFATE CR 30 MG PO TB12: 30.0000 mg | ORAL_TABLET | Freq: Two times a day (BID) | ORAL | Status: DC

## 2011-09-22 MED ORDER — INFLUENZA VIRUS VACC SPLIT PF IM SUSP
0.5000 mL | Freq: Once | INTRAMUSCULAR | Status: AC
  Administered 2011-09-22: 0.5 mL via INTRAMUSCULAR
  Filled 2011-09-22: qty 0.5

## 2011-09-22 NOTE — Progress Notes (Signed)
OFFICE PROGRESS NOTE   INTERVAL HISTORY:   He returns as scheduled. He continues close clinical followup with Dr.Morse. He was treated with drug eluting beads (doxorubicin) in November of 2012. A followup CT scan on 09/07/2011 revealed an increase in the size of several liver lesions. Others have decreased. The calcified mesenteric mass was slightly larger. He is now followed with an observation approach. He does not have diarrhea. He continues monthly Sandostatin. The pain is under good control with MS Contin. He takes a rare oxycodone tablet for breakthrough pain. He is having difficulty controlling his blood sugar. He is now maintained on Levemir.  Objective:  Vital signs in last 24 hours:  Blood pressure 139/96, pulse 90, temperature 98.4 F (36.9 C), temperature source Oral, height 5' 11.5" (1.816 m), weight 260 lb 11.2 oz (118.253 kg).   Resp: Lungs clear bilaterally Cardio: Regular rate and rhythm, no murmur GI: There is fullness in the right abdomen. No tenderness. I cannot palpate a discrete liver edge. Vascular: No leg edema    Medications: I have reviewed the patient's current medications.  Assessment/Plan: 1. Metastatic carcinoid tumor, status post treatment at [facility] on [facility] study with progressive disease noted on a restaging CT 06/25/2008.  He is status post SIR-Spheres therapy at [facility] in December 2009.  A restaging CT 03/22/2009 confirmed a decrease in the metastatic liver lesions compared to the November 2009 CT.   a. Restaging CT 09/04/2009 at [facility] confirmed progressive disease in the liver. b. Initiation of sorafenib therapy February 2011.  c. Restaging CT June 2011 with progressive disease in the liver. d. Initiation of Avastin/Xeloda June 2011  e. Initiation of Nexavar 08/14/2010 after a CT of the chest, abdomen and pelvis confirmed enlargement of liver lesions and a mesenteric mass. f. Restaging CT 04/14/2011 showed enlargement hepatic masses and a minimal  enlargement of the mesenteric mass g. Right hepatic artery embolization with drug-eluting beads 05/15/2011 h. Left lateral hepatic artery embolization with drug-eluting beads (doxorubicin) 06/15/2011 i. Restaging CT 09/07/2011 showed an increase in the size of several hepatic lesion and a decrease in other lesions. The mesenteric mass was slightly larger the 2. Carcinoid syndrome, controlled with monthly Sandostatin. 3. Pain secondary to metastatic disease involving the liver, controlled with MS Contin/oxycodone. 4. Hypertension. 5. Hypercholesterolemia, recently started on niacin. 6. Facial flushing and a "hot" sensation, potentially related to niacin. 7. History of Helicobacter pylori gastritis. 8. History of rectal bleeding. 9. Diabetes-now on insulin 10. Lumbar osteomyelitis in December 2008. 11. Status post resection of a right lung lesion in April 2010 with pathology revealing only a microscopic focus of carcinoid tumor in the submitted tissue.     Disposition:  He is now followed with an observation approach after a restaging CT evaluation 09/07/2011 revealed a mixed response to the drug eluting beads. He will see Dr.Morse in May for a restaging evaluation. He continues monthly Sandostatin.  He continues MS Contin and oxycodone for pain. We administered an influenza vaccine today.  He will return for an office visit here in 6 months. We will see him in the interim as needed.   Lucile Shutters, MD  09/22/2011  4:39 PM

## 2011-10-20 ENCOUNTER — Other Ambulatory Visit: Payer: Self-pay | Admitting: *Deleted

## 2011-10-20 DIAGNOSIS — E340 Carcinoid syndrome: Secondary | ICD-10-CM

## 2011-10-20 MED ORDER — OXYCODONE HCL 5 MG PO TABS: 5.0000 mg | ORAL_TABLET | ORAL | Status: DC | PRN

## 2011-10-20 MED ORDER — MORPHINE SULFATE CR 30 MG PO TB12: 30.0000 mg | ORAL_TABLET | Freq: Two times a day (BID) | ORAL | Status: DC

## 2011-10-20 NOTE — Telephone Encounter (Signed)
Notified patient that scripts are ready for pick up 

## 2011-11-16 ENCOUNTER — Telehealth: Payer: Self-pay | Admitting: *Deleted

## 2011-11-17 NOTE — Telephone Encounter (Signed)
Called patient back and discussed need for handicap placard with [employer]. He reports he is unable to walk 200 feet without stopping to rest. MD signed application and mailed to patient's home.

## 2011-12-01 ENCOUNTER — Other Ambulatory Visit: Payer: Self-pay | Admitting: *Deleted

## 2011-12-01 DIAGNOSIS — E340 Carcinoid syndrome: Secondary | ICD-10-CM

## 2011-12-01 MED ORDER — MORPHINE SULFATE CR 30 MG PO TB12: 30.0000 mg | ORAL_TABLET | Freq: Two times a day (BID) | ORAL | Status: DC

## 2011-12-01 MED ORDER — OXYCODONE HCL 5 MG PO TABS: 5.0000 mg | ORAL_TABLET | ORAL | Status: DC | PRN

## 2011-12-01 NOTE — Telephone Encounter (Signed)
Notified scripts are ready.

## 2012-01-07 ENCOUNTER — Other Ambulatory Visit: Payer: Self-pay | Admitting: *Deleted

## 2012-01-07 DIAGNOSIS — E340 Carcinoid syndrome: Secondary | ICD-10-CM

## 2012-01-07 MED ORDER — MORPHINE SULFATE ER 30 MG PO TBCR: 30.0000 mg | EXTENDED_RELEASE_TABLET | Freq: Two times a day (BID) | ORAL | Status: DC

## 2012-01-07 MED ORDER — OXYCODONE HCL 5 MG PO TABS: 5.0000 mg | ORAL_TABLET | ORAL | Status: DC | PRN

## 2012-02-09 ENCOUNTER — Other Ambulatory Visit: Payer: Self-pay | Admitting: *Deleted

## 2012-02-09 DIAGNOSIS — E340 Carcinoid syndrome: Secondary | ICD-10-CM

## 2012-02-09 MED ORDER — MORPHINE SULFATE ER 30 MG PO TBCR: 30.0000 mg | EXTENDED_RELEASE_TABLET | Freq: Two times a day (BID) | ORAL | Status: DC

## 2012-02-09 MED ORDER — OXYCODONE HCL 5 MG PO TABS: 5.0000 mg | ORAL_TABLET | ORAL | Status: DC | PRN

## 2012-03-08 ENCOUNTER — Telehealth: Payer: Self-pay | Admitting: *Deleted

## 2012-03-08 ENCOUNTER — Other Ambulatory Visit: Payer: Self-pay | Admitting: *Deleted

## 2012-03-08 ENCOUNTER — Ambulatory Visit (HOSPITAL_BASED_OUTPATIENT_CLINIC_OR_DEPARTMENT_OTHER): Payer: BC Managed Care – PPO | Admitting: Oncology

## 2012-03-08 VITALS — BP 128/91 | HR 91 | Temp 97.7°F | Resp 20 | Ht 71.5 in | Wt 269.7 lb

## 2012-03-08 DIAGNOSIS — R599 Enlarged lymph nodes, unspecified: Secondary | ICD-10-CM

## 2012-03-08 DIAGNOSIS — C7A00 Malignant carcinoid tumor of unspecified site: Secondary | ICD-10-CM

## 2012-03-08 DIAGNOSIS — E340 Carcinoid syndrome: Secondary | ICD-10-CM

## 2012-03-08 DIAGNOSIS — C787 Secondary malignant neoplasm of liver and intrahepatic bile duct: Secondary | ICD-10-CM

## 2012-03-08 DIAGNOSIS — E119 Type 2 diabetes mellitus without complications: Secondary | ICD-10-CM

## 2012-03-08 MED ORDER — OXYCODONE HCL 5 MG PO TABS: 5.0000 mg | ORAL_TABLET | ORAL | Status: DC | PRN

## 2012-03-08 MED ORDER — MORPHINE SULFATE ER 30 MG PO TBCR: 30.0000 mg | EXTENDED_RELEASE_TABLET | Freq: Two times a day (BID) | ORAL | Status: DC

## 2012-03-08 NOTE — Progress Notes (Signed)
   [facility]    OFFICE PROGRESS NOTE   INTERVAL HISTORY:   He returns as scheduled. He continues monthly Sandostatin. He reports 2-3 loose stools per day. The abdominal pain is controlled with MS Contin once daily and twice daily oxycodone. He has a nodule at the left side of the nose.  He underwent a restaging CT at [facility] in May and stable hepatic metastases were noted.  Objective:  Vital signs in last 24 hours:  Blood pressure 128/91, pulse 91, temperature 97.7 F (36.5 C), temperature source Oral, resp. rate 20, height 5' 11.5" (1.816 m), weight 269 lb 11.2 oz (122.335 kg).    HEENT: Neck without mass, there is a 1 cm cutaneous firm cystic lesion adjacent to the left nostril. Lymphatics: No cervical or supraclavicular nodes Resp: End inspiratory rhonchi/wheezing at the right greater than left upper and lower chest, good air movement bilaterally, no respiratory distress Cardio: Regular rate and rhythm GI: No splenomegaly. There is fullness in the right mid abdomen without a discrete liver edge Vascular: No leg edema    Medications: I have reviewed the patient's current medications.  Assessment/Plan: 1. Metastatic carcinoid tumor, status post treatment at [facility] on [facility] study with progressive disease noted on a restaging CT 06/25/2008. He is status post SIR-Spheres therapy at [facility] in December 2009. A restaging CT 03/22/2009 confirmed a decrease in the metastatic liver lesions compared to the November 2009 CT.  a. Restaging CT 09/04/2009 at [facility] confirmed progressive disease in the liver. b. Initiation of sorafenib therapy February 2011.  c. Restaging CT June 2011 with progressive disease in the liver. d. Initiation of Avastin/Xeloda June 2011  e. Initiation of Nexavar 08/14/2010 after a CT of the chest, abdomen and pelvis confirmed enlargement of liver lesions and a mesenteric mass. f. Restaging CT 04/14/2011 showed enlargement hepatic masses and a minimal  enlargement of the mesenteric mass g. Right hepatic artery embolization with drug-eluting beads 05/15/2011 h. Left lateral hepatic artery embolization with drug-eluting beads (doxorubicin) 06/15/2011 i. Restaging CT 09/07/2011 showed an increase in the size of several hepatic lesion and a decrease in other lesions. The mesenteric mass was slightly larger j. Restaging CT at [facility] on 12/07/2011 reveals stable hepatic metastases 2. Carcinoid syndrome, controlled with monthly Sandostatin. 3. Pain secondary to metastatic disease involving the liver, controlled with MS Contin/oxycodone. 4. Hypertension. 5. Hypercholesterolemia, recently started on niacin. 6. History of Facial flushing and a "hot" sensation, potentially related to niacin. 7. History of Helicobacter pylori gastritis. 8. History of rectal bleeding. 9. Diabetes-now on insulin 10. Lumbar osteomyelitis in December 2008. 11. Status post resection of a right lung lesion in April 2010 with pathology revealing only a microscopic focus of carcinoid tumor in the submitted tissue.  12. Nodular cutaneous lesion adjacent to the left nostril-likely a benign cyst, he plans to see a dermatologist   Disposition:  He appears stable with regard to the metastatic carcinoid tumor. He continues monthly Sandostatin and is scheduled for a restaging CT at [facility] in November. He will return for an office visit here after the restaging CT.   Thornton Papas, MD  03/08/2012  5:12 PM

## 2012-03-08 NOTE — Telephone Encounter (Signed)
Gave patient appointment for 06-20-2012 at 3:45pm

## 2012-04-11 ENCOUNTER — Other Ambulatory Visit: Payer: Self-pay | Admitting: *Deleted

## 2012-04-11 DIAGNOSIS — E340 Carcinoid syndrome: Secondary | ICD-10-CM

## 2012-04-11 MED ORDER — MORPHINE SULFATE ER 30 MG PO TBCR: 30.0000 mg | EXTENDED_RELEASE_TABLET | Freq: Two times a day (BID) | ORAL | Status: DC

## 2012-04-11 MED ORDER — OXYCODONE HCL 5 MG PO TABS: 5.0000 mg | ORAL_TABLET | ORAL | Status: DC | PRN

## 2012-04-11 NOTE — Telephone Encounter (Signed)
Received call from pt requesting refill on MS Contin and Oxycodone.  Called and spoke with pt. Informing him that prescriptions are ready for pick-up at [facility].  Pt verbalized understanding.

## 2012-05-11 ENCOUNTER — Telehealth: Payer: Self-pay | Admitting: *Deleted

## 2012-05-11 DIAGNOSIS — E340 Carcinoid syndrome: Secondary | ICD-10-CM

## 2012-05-11 MED ORDER — OXYCODONE HCL 5 MG PO TABS: 5.0000 mg | ORAL_TABLET | ORAL | Status: DC | PRN

## 2012-05-11 MED ORDER — MORPHINE SULFATE ER 30 MG PO TBCR: 30.0000 mg | EXTENDED_RELEASE_TABLET | Freq: Two times a day (BID) | ORAL | Status: DC

## 2012-05-11 NOTE — Telephone Encounter (Signed)
Call from pt requesting refills of pain medications. Returned call to pt, informing him he can pick up rx on 05/12/12.

## 2012-06-13 ENCOUNTER — Other Ambulatory Visit: Payer: Self-pay | Admitting: Nurse Practitioner

## 2012-06-13 DIAGNOSIS — E340 Carcinoid syndrome: Secondary | ICD-10-CM

## 2012-06-13 MED ORDER — MORPHINE SULFATE ER 30 MG PO TBCR: 30.0000 mg | EXTENDED_RELEASE_TABLET | Freq: Two times a day (BID) | ORAL | Status: DC

## 2012-06-13 MED ORDER — OXYCODONE HCL 5 MG PO TABS: 5.0000 mg | ORAL_TABLET | ORAL | Status: DC | PRN

## 2012-06-15 ENCOUNTER — Telehealth: Payer: Self-pay | Admitting: *Deleted

## 2012-06-15 NOTE — Telephone Encounter (Signed)
Called and spoke with pt informing him pain scripts are ready to be picked up at his convenience.  Pt verbalized understanding.

## 2012-06-20 ENCOUNTER — Ambulatory Visit (HOSPITAL_BASED_OUTPATIENT_CLINIC_OR_DEPARTMENT_OTHER): Payer: BC Managed Care – PPO | Admitting: Oncology

## 2012-06-20 VITALS — BP 130/82 | HR 84 | Temp 98.0°F | Resp 20 | Ht 71.5 in | Wt 276.5 lb

## 2012-06-20 DIAGNOSIS — C7A00 Malignant carcinoid tumor of unspecified site: Secondary | ICD-10-CM

## 2012-06-20 DIAGNOSIS — E340 Carcinoid syndrome: Secondary | ICD-10-CM

## 2012-06-20 DIAGNOSIS — Z23 Encounter for immunization: Secondary | ICD-10-CM

## 2012-06-20 DIAGNOSIS — C787 Secondary malignant neoplasm of liver and intrahepatic bile duct: Secondary | ICD-10-CM

## 2012-06-20 MED ORDER — INFLUENZA VIRUS VACC SPLIT PF IM SUSP
0.5000 mL | Freq: Once | INTRAMUSCULAR | Status: AC
  Administered 2012-06-20: 0.5 mL via INTRAMUSCULAR

## 2012-06-20 NOTE — Progress Notes (Signed)
   [facility]    OFFICE PROGRESS NOTE   INTERVAL HISTORY:   He returns as scheduled. The abdominal pain has improved. He is no longer taking MS Contin. He reports taking approximately 5-6 oxycodone tablets per week. Good appetite and energy level. 2-3 bowel movements per day with no diarrhea. He continues monthly Sandostatin.  A restaging CT at [facility] on 06/13/2012 revealed 2 subtle areas of hypertension in the anterior left liver. One of the lesions appears new. The other lesion was seen on a previous study. Multiple additional lesions appeared decreased in size and number compared to a CT from 12/07/2011. Stable small lymph nodes in the upper abdomen and pelvis. Stable partially calcified mesenteric mass.  He complains of erectile dysfunction. He has difficulty obtaining and maintaining an erection.  Objective:  Vital signs in last 24 hours:  Blood pressure 130/82, pulse 84, temperature 98 F (36.7 C), resp. rate 20, height 5' 11.5" (1.816 m), weight 276 lb 8 oz (125.42 kg).    HEENT: Neck without mass Resp: Lungs clear bilaterally Cardio: Regular rate and rhythm GI: The liver edge is palpable in the right mid abdomen. No apparent ascites. No splenomegaly. Vascular: No leg edema   Medications: I have reviewed the patient's current medications.  Assessment/Plan: 1. Metastatic carcinoid tumor, status post treatment at [facility] on [facility] study with progressive disease noted on a restaging CT 06/25/2008. He is status post SIR-Spheres therapy at [facility] in December 2009. A restaging CT 03/22/2009 confirmed a decrease in the metastatic liver lesions compared to the November 2009 CT.  a. Restaging CT 09/04/2009 at [facility] confirmed progressive disease in the liver. b. Initiation of sorafenib therapy February 2011.  c. Restaging CT June 2011 with progressive disease in the liver. d. Initiation of Avastin/Xeloda June 2011  e. Initiation of Nexavar 08/14/2010 after a CT of the  chest, abdomen and pelvis confirmed enlargement of liver lesions and a mesenteric mass. f. Restaging CT 04/14/2011 showed enlargement hepatic masses and a minimal enlargement of the mesenteric mass g. Right hepatic artery embolization with drug-eluting beads 05/15/2011 h. Left lateral hepatic artery embolization with drug-eluting beads (doxorubicin) 06/15/2011 i. Restaging CT 09/07/2011 showed an increase in the size of several hepatic lesion and a decrease in other lesions. The mesenteric mass was slightly larger j. Restaging CT at [facility] on 12/07/2011 reveals stable hepatic metastases k. Restaging CT at [facility] on 06/13/2012 reveal overall improvement in the multiple hypoattenuating liver lesions with 2 hyperattenuating lesions felt to be new versus increased in conspicuity do to differences in phase enhancement 2. Carcinoid syndrome, controlled with monthly Sandostatin. 3. Pain secondary to metastatic disease involving the liver, improved, no longer taking MS Contin 4. Hypertension. 5. Hypercholesterolemia 6. History of Facial flushing and a "hot" sensation, potentially related to niacin. 7. History of Helicobacter pylori gastritis. 8. History of rectal bleeding. 9. Diabetes-on insulin and metformin 10. Lumbar osteomyelitis in December 2008. 11. Status post resection of a right lung lesion in April 2010 with pathology revealing only a microscopic focus of carcinoid tumor in the submitted tissue.  12. Erectile dysfunction-we will make a urology referral   Disposition:  He appears stable from an oncology standpoint. He continues monthly Sandostatin. He reports being scheduled for a six-month interval CT at [facility]. He will return for an office visit here in 6 months. We will see him in the interim as needed.     Thornton Papas, MD  06/20/2012  4:54 PM

## 2012-06-22 ENCOUNTER — Telehealth: Payer: Self-pay | Admitting: Oncology

## 2012-06-22 NOTE — Telephone Encounter (Signed)
Pt will see Dr Patsi Sears, on 07/18/12 2:45pm pt aware of appt gave HIM referral to send chart

## 2012-06-22 NOTE — Telephone Encounter (Signed)
S/w pt to confirm appt for 5/19 he is aware.

## 2012-06-24 ENCOUNTER — Telehealth: Payer: Self-pay | Admitting: Oncology

## 2012-06-24 NOTE — Telephone Encounter (Signed)
Faxed pt medical records to Dr. Uvaldo Bristle

## 2012-07-20 ENCOUNTER — Other Ambulatory Visit: Payer: Self-pay | Admitting: *Deleted

## 2012-07-20 DIAGNOSIS — E340 Carcinoid syndrome: Secondary | ICD-10-CM

## 2012-07-20 MED ORDER — OXYCODONE HCL 5 MG PO TABS: 5.0000 mg | ORAL_TABLET | ORAL | Status: DC | PRN

## 2012-07-20 NOTE — Telephone Encounter (Signed)
Called and spoke with pt---informed pain script is ready for pick-up.  Pt verbalized understanding and expressed appreciation for call.

## 2012-08-23 ENCOUNTER — Other Ambulatory Visit: Payer: Self-pay | Admitting: *Deleted

## 2012-08-23 DIAGNOSIS — E340 Carcinoid syndrome: Secondary | ICD-10-CM

## 2012-08-23 MED ORDER — MORPHINE SULFATE ER 30 MG PO TBCR: 30.0000 mg | EXTENDED_RELEASE_TABLET | Freq: Two times a day (BID) | ORAL | Status: DC

## 2012-08-23 MED ORDER — OXYCODONE HCL 5 MG PO TABS: 5.0000 mg | ORAL_TABLET | ORAL | Status: DC | PRN

## 2012-08-23 NOTE — Telephone Encounter (Signed)
Call from pt requesting refill on his pain medication. Refills left in prescription book for pick up.

## 2012-09-22 ENCOUNTER — Other Ambulatory Visit: Payer: Self-pay | Admitting: *Deleted

## 2012-09-22 DIAGNOSIS — E340 Carcinoid syndrome: Secondary | ICD-10-CM

## 2012-09-22 MED ORDER — OXYCODONE HCL 5 MG PO TABS: 5.0000 mg | ORAL_TABLET | ORAL | Status: DC | PRN

## 2012-10-24 ENCOUNTER — Other Ambulatory Visit: Payer: Self-pay | Admitting: *Deleted

## 2012-10-24 DIAGNOSIS — E340 Carcinoid syndrome: Secondary | ICD-10-CM

## 2012-10-24 MED ORDER — OXYCODONE HCL 5 MG PO TABS: 5.0000 mg | ORAL_TABLET | ORAL | Status: DC | PRN

## 2012-10-24 NOTE — Telephone Encounter (Signed)
Received message from pt requesting re-fill of pain med.  Called and spoke with pt instructing that pt can pick-up script at his convenience.  Pt verbalized understanding of instruction and expressed appreciation for call back.

## 2012-11-28 ENCOUNTER — Other Ambulatory Visit: Payer: Self-pay | Admitting: *Deleted

## 2012-11-28 DIAGNOSIS — E340 Carcinoid syndrome: Secondary | ICD-10-CM

## 2012-11-28 MED ORDER — OXYCODONE HCL 5 MG PO TABS: 5.0000 mg | ORAL_TABLET | ORAL | Status: DC | PRN

## 2012-11-28 MED ORDER — MORPHINE SULFATE ER 30 MG PO TBCR: 30.0000 mg | EXTENDED_RELEASE_TABLET | Freq: Two times a day (BID) | ORAL | Status: DC

## 2012-12-19 ENCOUNTER — Telehealth: Payer: Self-pay | Admitting: Oncology

## 2012-12-19 ENCOUNTER — Ambulatory Visit (HOSPITAL_BASED_OUTPATIENT_CLINIC_OR_DEPARTMENT_OTHER): Payer: BC Managed Care – PPO | Admitting: Oncology

## 2012-12-19 VITALS — BP 138/89 | HR 85 | Temp 98.0°F | Resp 18 | Ht 71.5 in | Wt 268.2 lb

## 2012-12-19 DIAGNOSIS — E119 Type 2 diabetes mellitus without complications: Secondary | ICD-10-CM

## 2012-12-19 DIAGNOSIS — E340 Carcinoid syndrome: Secondary | ICD-10-CM

## 2012-12-19 DIAGNOSIS — C7A00 Malignant carcinoid tumor of unspecified site: Secondary | ICD-10-CM

## 2012-12-19 DIAGNOSIS — C787 Secondary malignant neoplasm of liver and intrahepatic bile duct: Secondary | ICD-10-CM

## 2012-12-19 NOTE — Progress Notes (Signed)
[facility]    OFFICE PROGRESS NOTE   INTERVAL HISTORY:   He returns as scheduled. Good appetite and energy level. He is no longer taking MS Contin. He takes approximately 2 oxycodone tablets per day for relief of abdominal pain. No diarrhea. He continues monthly Sandostatin. He has occasional flushing episodes. He relates this to taking Niaspan.  He underwent a restaging CT at [facility] on 12/12/2012. Compared to a CT from 06/13/2012 comparison between liver lesions was felt to be difficult secondary to differences in timing of contrast. Partially calcified mesenteric mass was present previously. There are both hyperenhancing and hypodense lesions in the liver. Hypodense lesions are smaller. One of the hyperenhancing lesions in the left lobe is not apparent on the current study. The calcified mesenteric mass is slightly larger.  He reports Dr.Morse did not notice significant change in the CT. A followup CT will be scheduled for 9 months.  Objective:  Vital signs in last 24 hours:  Blood pressure 138/89, pulse 85, temperature 98 F (36.7 C), temperature source Oral, resp. rate 18, height 5' 11.5" (1.816 m), weight 268 lb 3.2 oz (121.655 kg).    HEENT: Neck without mass Lymphatics: No cervical, supraclavicular ,axillary, or inguinal nodes Resp: Lungs clear bilaterally Cardio: Regular rate and rhythm GI: Fullness in the right abdomen without a discrete liver edge. No tenderness. Vascular: No leg edema   Lab Results: Chromogranin A level on 12/12/2012-to 38   Medications: I have reviewed the patient's current medications.  Assessment/Plan: 1. Metastatic carcinoid tumor, status post treatment at [facility] on [facility] study with progressive disease noted on a restaging CT 06/25/2008. He is status post SIR-Spheres therapy at [facility] in December 2009. A restaging CT 03/22/2009 confirmed a decrease in the metastatic liver lesions compared to the November 2009 CT.  a. Restaging CT  09/04/2009 at [facility] confirmed progressive disease in the liver. b. Initiation of sorafenib therapy February 2011.  c. Restaging CT June 2011 with progressive disease in the liver. d. Initiation of Avastin/Xeloda June 2011  e. Initiation of Nexavar 08/14/2010 after a CT of the chest, abdomen and pelvis confirmed enlargement of liver lesions and a mesenteric mass. f. Restaging CT 04/14/2011 showed enlargement hepatic masses and a minimal enlargement of the mesenteric mass g. Right hepatic artery embolization with drug-eluting beads 05/15/2011 h. Left lateral hepatic artery embolization with drug-eluting beads (doxorubicin) 06/15/2011 i. Restaging CT 09/07/2011 showed an increase in the size of several hepatic lesion and a decrease in other lesions. The mesenteric mass was slightly larger j. Restaging CT at [facility] on 12/07/2011 reveals stable hepatic metastases k. Restaging CT at [facility] on 06/13/2012 reveal overall improvement in the multiple hypoattenuating liver lesions with 2 hyperattenuating lesions felt to be new versus increased in conspicuity do to differences in phase enhancement l. Restaging CT at [facility] on 12/12/2012-no significant change in the metastatic tumor burden, minimal increase in the calcified mesenteric mass 2. Carcinoid syndrome, controlled with monthly Sandostatin. 3. Pain secondary to metastatic disease involving the liver, improved, no longer taking MS Contin 4. Hypertension. 5. Hypercholesterolemia 6. History of Facial flushing and a "hot" sensation, potentially related to niacin. 7. History of Helicobacter pylori gastritis. 8. History of rectal bleeding. 9. Diabetes-on insulin and metformin 10. Lumbar osteomyelitis in December 2008. 11. Status post resection of a right lung lesion in April 2010 with pathology revealing only a microscopic focus of carcinoid tumor in the submitted tissue.  12. Erectile dysfunction  Disposition:  Frank Pennington appears unchanged.  He remains off  of specific therapy for the metastatic carcinoid tumor. He will be scheduled for a restaging CT at a 9 month interval. He will continue monthly Sandostatin. Frank Pennington will contact myself or Dr. Lattie Corns for new symptoms. I will see him after the restaging CT in 9 months.   Frank Papas, MD  12/19/2012  4:42 PM

## 2012-12-19 NOTE — Addendum Note (Signed)
Addended by: Wandalee Ferdinand on: 12/19/2012 05:35 PM   Modules accepted: Orders

## 2012-12-19 NOTE — Telephone Encounter (Signed)
gv and printed appt sched and avs for pt  °

## 2012-12-29 ENCOUNTER — Other Ambulatory Visit: Payer: Self-pay | Admitting: *Deleted

## 2012-12-29 DIAGNOSIS — E340 Carcinoid syndrome: Secondary | ICD-10-CM

## 2012-12-29 MED ORDER — OXYCODONE HCL 5 MG PO TABS: 5.0000 mg | ORAL_TABLET | ORAL | Status: DC | PRN

## 2012-12-29 NOTE — Telephone Encounter (Signed)
Notified pt that pain script ready for p/u at his convenience.  Pt verbalized understanding and expressed appreciation for call back

## 2013-01-30 ENCOUNTER — Other Ambulatory Visit: Payer: Self-pay | Admitting: *Deleted

## 2013-01-30 DIAGNOSIS — E340 Carcinoid syndrome: Secondary | ICD-10-CM

## 2013-01-30 MED ORDER — OXYCODONE HCL 5 MG PO TABS: 5.0000 mg | ORAL_TABLET | ORAL | Status: DC | PRN

## 2013-01-30 NOTE — Telephone Encounter (Signed)
Call from pt requesting refill of Percocet.

## 2013-03-03 ENCOUNTER — Other Ambulatory Visit: Payer: Self-pay | Admitting: *Deleted

## 2013-03-03 DIAGNOSIS — E340 Carcinoid syndrome: Secondary | ICD-10-CM

## 2013-03-03 MED ORDER — OXYCODONE HCL 5 MG PO TABS: 5.0000 mg | ORAL_TABLET | ORAL | Status: DC | PRN

## 2013-03-03 NOTE — Telephone Encounter (Signed)
Message from pt requesting refill on Oxycodone. Rx to be left in prescription book for pick up.

## 2013-04-04 ENCOUNTER — Other Ambulatory Visit: Payer: Self-pay | Admitting: *Deleted

## 2013-04-04 DIAGNOSIS — E340 Carcinoid syndrome: Secondary | ICD-10-CM

## 2013-04-04 MED ORDER — OXYCODONE HCL 5 MG PO TABS: 5.0000 mg | ORAL_TABLET | ORAL | Status: DC | PRN

## 2013-04-04 NOTE — Telephone Encounter (Signed)
Message from pt requesting refill on Oxycodone. Will be left in prescription book for pick up.

## 2013-05-05 ENCOUNTER — Other Ambulatory Visit: Payer: Self-pay | Admitting: *Deleted

## 2013-05-05 DIAGNOSIS — E340 Carcinoid syndrome: Secondary | ICD-10-CM

## 2013-05-05 MED ORDER — OXYCODONE HCL 5 MG PO TABS: 5.0000 mg | ORAL_TABLET | ORAL | Status: DC | PRN

## 2013-06-05 ENCOUNTER — Other Ambulatory Visit: Payer: Self-pay | Admitting: *Deleted

## 2013-06-05 DIAGNOSIS — E340 Carcinoid syndrome: Secondary | ICD-10-CM

## 2013-06-05 MED ORDER — OXYCODONE HCL 5 MG PO TABS: 5.0000 mg | ORAL_TABLET | ORAL | Status: DC | PRN

## 2013-06-05 NOTE — Telephone Encounter (Signed)
Message from pt requesting refill on Oxycodone. Reviewed with Dr. Truett Perna: Refill will be left in prescription book for pick up.

## 2013-06-08 ENCOUNTER — Other Ambulatory Visit: Payer: Self-pay

## 2013-07-04 ENCOUNTER — Other Ambulatory Visit: Payer: Self-pay | Admitting: *Deleted

## 2013-07-04 DIAGNOSIS — E340 Carcinoid syndrome: Secondary | ICD-10-CM

## 2013-07-04 MED ORDER — OXYCODONE HCL 5 MG PO TABS: 5.0000 mg | ORAL_TABLET | ORAL | Status: DC | PRN

## 2013-07-04 NOTE — Telephone Encounter (Signed)
Message from pt requesting refill on Oxycodone. Rx will be left in prescription book for pick up.  

## 2013-08-07 ENCOUNTER — Other Ambulatory Visit: Payer: Self-pay | Admitting: *Deleted

## 2013-08-07 DIAGNOSIS — E340 Carcinoid syndrome: Secondary | ICD-10-CM

## 2013-08-07 MED ORDER — OXYCODONE HCL 5 MG PO TABS: 5.0000 mg | ORAL_TABLET | ORAL | Status: DC | PRN

## 2013-08-07 NOTE — Telephone Encounter (Signed)
Left VM requesting refill on his pain meds.

## 2013-09-11 ENCOUNTER — Other Ambulatory Visit: Payer: Self-pay | Admitting: *Deleted

## 2013-09-11 DIAGNOSIS — E340 Carcinoid syndrome: Secondary | ICD-10-CM

## 2013-09-11 MED ORDER — OXYCODONE HCL 5 MG PO TABS: 5.0000 mg | ORAL_TABLET | ORAL | Status: DC | PRN

## 2013-09-27 ENCOUNTER — Telehealth: Payer: Self-pay | Admitting: Oncology

## 2013-09-27 ENCOUNTER — Telehealth: Payer: Self-pay | Admitting: *Deleted

## 2013-09-27 NOTE — Telephone Encounter (Signed)
Talked to pt and r/s appt to March per POF

## 2013-09-27 NOTE — Telephone Encounter (Signed)
Spoke with pt, he agrees to reschedule office visit. Order sent to schedulers for 1 mo appt. Pt will see MD at [facility] prior to appt.

## 2013-09-28 ENCOUNTER — Ambulatory Visit: Payer: BC Managed Care – PPO | Admitting: Oncology

## 2013-10-09 ENCOUNTER — Other Ambulatory Visit: Payer: Self-pay | Admitting: *Deleted

## 2013-10-09 DIAGNOSIS — E340 Carcinoid syndrome: Secondary | ICD-10-CM

## 2013-10-09 MED ORDER — OXYCODONE HCL 5 MG PO TABS: 5.0000 mg | ORAL_TABLET | ORAL | Status: DC | PRN

## 2013-10-11 ENCOUNTER — Encounter (HOSPITAL_COMMUNITY): Payer: Self-pay | Admitting: Emergency Medicine

## 2013-10-11 ENCOUNTER — Emergency Department (HOSPITAL_COMMUNITY): Payer: BC Managed Care – PPO

## 2013-10-11 ENCOUNTER — Observation Stay (HOSPITAL_COMMUNITY)
Admission: EM | Admit: 2013-10-11 | Discharge: 2013-10-12 | Disposition: A | Discharge status: Home / Self Care | Payer: BC Managed Care – PPO | Attending: Emergency Medicine | Admitting: Emergency Medicine

## 2013-10-11 DIAGNOSIS — R197 Diarrhea, unspecified: Secondary | ICD-10-CM | POA: Insufficient documentation

## 2013-10-11 DIAGNOSIS — Z87891 Personal history of nicotine dependence: Secondary | ICD-10-CM | POA: Insufficient documentation

## 2013-10-11 DIAGNOSIS — I10 Essential (primary) hypertension: Secondary | ICD-10-CM

## 2013-10-11 DIAGNOSIS — E3400 Carcinoid syndrome, unspecified: Secondary | ICD-10-CM

## 2013-10-11 DIAGNOSIS — Z794 Long term (current) use of insulin: Secondary | ICD-10-CM | POA: Insufficient documentation

## 2013-10-11 DIAGNOSIS — R079 Chest pain, unspecified: Secondary | ICD-10-CM

## 2013-10-11 DIAGNOSIS — R300 Dysuria: Secondary | ICD-10-CM | POA: Insufficient documentation

## 2013-10-11 DIAGNOSIS — E785 Hyperlipidemia, unspecified: Secondary | ICD-10-CM

## 2013-10-11 DIAGNOSIS — R0789 Other chest pain: Principal | ICD-10-CM | POA: Insufficient documentation

## 2013-10-11 DIAGNOSIS — G8929 Other chronic pain: Secondary | ICD-10-CM | POA: Insufficient documentation

## 2013-10-11 DIAGNOSIS — R002 Palpitations: Secondary | ICD-10-CM

## 2013-10-11 DIAGNOSIS — E669 Obesity, unspecified: Secondary | ICD-10-CM

## 2013-10-11 DIAGNOSIS — Z79899 Other long term (current) drug therapy: Secondary | ICD-10-CM | POA: Insufficient documentation

## 2013-10-11 DIAGNOSIS — R110 Nausea: Secondary | ICD-10-CM | POA: Insufficient documentation

## 2013-10-11 DIAGNOSIS — E119 Type 2 diabetes mellitus without complications: Secondary | ICD-10-CM

## 2013-10-11 DIAGNOSIS — R339 Retention of urine, unspecified: Secondary | ICD-10-CM | POA: Insufficient documentation

## 2013-10-11 HISTORY — DX: Carcinoid syndrome: E34.0

## 2013-10-11 HISTORY — DX: Carcinoid syndrome, unspecified: E34.00

## 2013-10-11 HISTORY — DX: Chest pain, unspecified: R07.9

## 2013-10-11 LAB — I-STAT TROPONIN, ED: TROPONIN I, POC: 0 ng/mL (ref 0.00–0.08)

## 2013-10-11 LAB — BASIC METABOLIC PANEL
BUN: 21 mg/dL (ref 6–23)
CHLORIDE: 97 meq/L (ref 96–112)
CO2: 26 meq/L (ref 19–32)
Calcium: 9.8 mg/dL (ref 8.4–10.5)
Creatinine, Ser: 0.86 mg/dL (ref 0.50–1.35)
GFR calc Af Amer: >90 mL/min (ref >90)
GFR calc non Af Amer: >90 mL/min (ref >90)
Glucose, Bld: 172 mg/dL — ABNORMAL HIGH (ref 70–99)
POTASSIUM: 4.2 meq/L (ref 3.7–5.3)
Sodium: 137 mEq/L (ref 137–147)

## 2013-10-11 LAB — CBC
HCT: 41.1 % (ref 39.0–52.0)
HEMATOCRIT: 42.3 % (ref 39.0–52.0)
HEMOGLOBIN: 14.3 g/dL (ref 13.0–17.0)
Hemoglobin: 14.7 g/dL (ref 13.0–17.0)
MCH: 28.9 pg (ref 26.0–34.0)
MCH: 28.9 pg (ref 26.0–34.0)
MCHC: 34.8 g/dL (ref 30.0–36.0)
MCHC: 34.8 g/dL (ref 30.0–36.0)
MCV: 83.1 fL (ref 78.0–100.0)
MCV: 83.2 fL (ref 78.0–100.0)
PLATELETS: 242 10*3/uL (ref 150–400)
Platelets: 221 10*3/uL (ref 150–400)
RBC: 5.09 MIL/uL (ref 4.22–5.81)
RBC: 4.94 MIL/uL (ref 4.22–5.81)
RDW: 12.9 % (ref 11.5–15.5)
RDW: 13.2 % (ref 11.5–15.5)
WBC: 10.6 10*3/uL — AB (ref 4.0–10.5)
WBC: 8.2 10*3/uL (ref 4.0–10.5)

## 2013-10-11 LAB — PROTIME-INR
INR: 1.02 (ref 0.00–1.49)
Prothrombin Time: 13.2 seconds (ref 11.6–15.2)

## 2013-10-11 LAB — TROPONIN I: Troponin I: <0.3 ng/mL (ref <0.30)

## 2013-10-11 LAB — GLUCOSE, CAPILLARY
GLUCOSE-CAPILLARY: 169 mg/dL — AB (ref 70–99)
Glucose-Capillary: 143 mg/dL — ABNORMAL HIGH (ref 70–99)
Glucose-Capillary: 202 mg/dL — ABNORMAL HIGH (ref 70–99)

## 2013-10-11 LAB — URINALYSIS, ROUTINE W REFLEX MICROSCOPIC
Bilirubin Urine: NEGATIVE
Glucose, UA: NEGATIVE mg/dL
Hgb urine dipstick: NEGATIVE
Ketones, ur: 15 mg/dL — AB
LEUKOCYTES UA: NEGATIVE
Nitrite: NEGATIVE
PROTEIN: NEGATIVE mg/dL
Specific Gravity, Urine: 1.018 (ref 1.005–1.030)
UROBILINOGEN UA: 0.2 mg/dL (ref 0.0–1.0)
pH: 6 (ref 5.0–8.0)

## 2013-10-11 LAB — APTT: APTT: 26 s (ref 24–37)

## 2013-10-11 LAB — CREATININE, SERUM
CREATININE: 0.8 mg/dL (ref 0.50–1.35)
GFR calc Af Amer: >90 mL/min (ref >90)
GFR calc non Af Amer: >90 mL/min (ref >90)

## 2013-10-11 LAB — HEMOGLOBIN A1C
HEMOGLOBIN A1C: 10.5 % — AB (ref <5.7)
Mean Plasma Glucose: 255 mg/dL — ABNORMAL HIGH (ref <117)

## 2013-10-11 MED ORDER — NITROGLYCERIN 0.4 MG SL SUBL: 0.4000 mg | SUBLINGUAL_TABLET | SUBLINGUAL | Status: DC | PRN

## 2013-10-11 MED ORDER — HEPARIN SODIUM (PORCINE) 5000 UNIT/ML IJ SOLN
5000.0000 [IU] | Freq: Three times a day (TID) | INTRAMUSCULAR | Status: DC
  Administered 2013-10-11 – 2013-10-12 (×4): 5000 [IU] via SUBCUTANEOUS
  Filled 2013-10-11 (×6): qty 1

## 2013-10-11 MED ORDER — VALSARTAN-HYDROCHLOROTHIAZIDE 160-25 MG PO TABS: 1.0000 | ORAL_TABLET | Freq: Every day | ORAL | Status: DC

## 2013-10-11 MED ORDER — METOPROLOL TARTRATE 12.5 MG HALF TABLET
12.5000 mg | ORAL_TABLET | Freq: Two times a day (BID) | ORAL | Status: DC
  Administered 2013-10-11 – 2013-10-12 (×2): 12.5 mg via ORAL
  Filled 2013-10-11 (×4): qty 1

## 2013-10-11 MED ORDER — NITROGLYCERIN 0.4 MG SL SUBL
0.4000 mg | SUBLINGUAL_TABLET | SUBLINGUAL | Status: DC | PRN
  Administered 2013-10-11 (×2): 0.4 mg via SUBLINGUAL

## 2013-10-11 MED ORDER — ASPIRIN 300 MG RE SUPP
300.0000 mg | RECTAL | Status: AC
  Filled 2013-10-11: qty 1

## 2013-10-11 MED ORDER — ASPIRIN EC 81 MG PO TBEC
81.0000 mg | DELAYED_RELEASE_TABLET | Freq: Every day | ORAL | Status: DC
  Administered 2013-10-12: 81 mg via ORAL
  Filled 2013-10-11: qty 1

## 2013-10-11 MED ORDER — IRBESARTAN 150 MG PO TABS
150.0000 mg | ORAL_TABLET | Freq: Every day | ORAL | Status: DC
  Administered 2013-10-12: 150 mg via ORAL
  Filled 2013-10-11 (×2): qty 1

## 2013-10-11 MED ORDER — ASPIRIN 81 MG PO CHEW
324.0000 mg | CHEWABLE_TABLET | ORAL | Status: AC
  Administered 2013-10-11: 324 mg via ORAL
  Filled 2013-10-11: qty 4

## 2013-10-11 MED ORDER — INSULIN DETEMIR 100 UNIT/ML ~~LOC~~ SOLN
30.0000 [IU] | Freq: Every day | SUBCUTANEOUS | Status: DC
  Administered 2013-10-12: 30 [IU] via SUBCUTANEOUS
  Filled 2013-10-11 (×2): qty 0.3

## 2013-10-11 MED ORDER — MORPHINE SULFATE 4 MG/ML IJ SOLN
4.0000 mg | Freq: Once | INTRAMUSCULAR | Status: AC
  Administered 2013-10-11: 4 mg via INTRAVENOUS
  Filled 2013-10-11: qty 1

## 2013-10-11 MED ORDER — HYDROCHLOROTHIAZIDE 25 MG PO TABS
25.0000 mg | ORAL_TABLET | Freq: Every day | ORAL | Status: DC
  Administered 2013-10-12: 25 mg via ORAL
  Filled 2013-10-11 (×2): qty 1

## 2013-10-11 MED ORDER — OXYCODONE HCL 5 MG PO TABS
5.0000 mg | ORAL_TABLET | ORAL | Status: DC | PRN
  Administered 2013-10-11: 5 mg via ORAL
  Filled 2013-10-11: qty 1

## 2013-10-11 MED ORDER — INSULIN ASPART 100 UNIT/ML ~~LOC~~ SOLN
10.0000 [IU] | Freq: Three times a day (TID) | SUBCUTANEOUS | Status: DC
  Administered 2013-10-11 – 2013-10-12 (×2): 10 [IU] via SUBCUTANEOUS

## 2013-10-11 NOTE — ED Provider Notes (Signed)
CSN: [id number]     Arrival date & time 10/11/13  7829 History   First MD Initiated Contact with Patient 10/11/13 [phone]     Chief Complaint  Patient presents with  . Chest Pain     (Consider location/radiation/quality/duration/timing/severity/associated sxs/prior Treatment) HPI Comments: Patient presents to the ED with a chief complaint of chest pain.  Patient states that the pain started this morning around 3:00 am.  It awoke him from sleep.  He states that the pain radiates to the left arm.  He describes it as a dull ache.  He denies any associated SOB or diaphoresis.  He does endorse associated nausea.  States that the pain is 2/10.  He has had aspirin PTA.  Cardiac risk factors include HTN, DM, smoking history and family history.  He states that he was recently seen by Dr. Nadyne Coombes, and had a normal echo.  He is scheduled to have a stress test.  Additionally, he complains of some dysuria and urinary hesitancy.  He also complains of diarrhea for the past 2 weeks.  He thinks that these symptoms are related to his carcinoid syndrome.  The history is provided by the patient. No language interpreter was used.    Past Medical History  Diagnosis Date  . metastatic carcinoid 2008  . Hypertension   . Diabetes mellitus   . Hypercholesteremia   . Chronic pain     r/t cancer   History reviewed. No pertinent past surgical history. No family history on file. History  Substance Use Topics  . Smoking status: Former Smoker    Quit date: 08/03/2008  . Smokeless tobacco: Not on file  . Alcohol Use: No    Review of Systems  All other systems reviewed and are negative.      Allergies  Review of patient's allergies indicates no known allergies.  Home Medications   Current Outpatient Rx  Name  Route  Sig  Dispense  Refill  . ACCU-CHEK FASTCLIX LANCETS MISC               . ACCU-CHEK SMARTVIEW test strip      Daily.         Marland Kitchen atorvastatin (LIPITOR) 40 MG tablet   Oral   Take 40  mg by mouth daily.           . B-D ULTRAFINE III SHORT PEN 31G X 8 MM MISC               . BYDUREON 2 MG SUSR   Subcutaneous   Inject 2 mg into the skin Once a week.         . insulin aspart (NOVOLOG) 100 UNIT/ML injection   Subcutaneous   Inject into the skin. Sliding scale for CBG >140 Average dose is 10 units         . LEVEMIR FLEXPEN 100 UNIT/ML injection   Subcutaneous   Inject 60 Units into the skin daily. 40-50 units         . lidocaine-hydrocortisone (ANAMANTEL HC) 3-0.5 % CREA   Topical   Apply topically as needed.         . metFORMIN (GLUCOPHAGE) 1000 MG tablet   Oral   Take 1,000 mg by mouth 2 (two) times daily.         Marland Kitchen NIASPAN 500 MG CR tablet   Oral   Take 500 mg by mouth 2 (two) times daily.          Marland Kitchen octreotide (SANDOSTATIN LAR  DEPOT) 30 MG injection   Intramuscular   Inject 30 mg into the muscle every 28 (twenty-eight) days.           . ondansetron (ZOFRAN) 8 MG tablet   Oral   Take 8 mg by mouth as needed.           Marland Kitchen oxyCODONE (OXY IR/ROXICODONE) 5 MG immediate release tablet   Oral   Take 1 tablet (5 mg total) by mouth every 4 (four) hours as needed for severe pain.   90 tablet   0   . valsartan-hydrochlorothiazide (DIOVAN-HCT) 160-25 MG per tablet   Oral   Take 1 tablet by mouth daily.            BP 128/81  Pulse 75  Temp(Src) 96.8 F (36 C) (Oral)  Resp 16  Ht 6' (1.829 m)  Wt 255 lb (115.667 kg)  BMI 34.58 kg/m2  SpO2 99% Physical Exam  Nursing note and vitals reviewed. Constitutional: He is oriented to person, place, and time. He appears well-developed and well-nourished.  HENT:  Head: Normocephalic and atraumatic.  Eyes: Conjunctivae and EOM are normal. Pupils are equal, round, and reactive to light. Right eye exhibits no discharge. Left eye exhibits no discharge. No scleral icterus.  Neck: Normal range of motion. Neck supple. No JVD present.  Cardiovascular: Normal rate, regular rhythm and normal heart  sounds.  Exam reveals no gallop and no friction rub.   No murmur heard. Pulmonary/Chest: Effort normal and breath sounds normal. No respiratory distress. He has no wheezes. He has no rales. He exhibits no tenderness.  Abdominal: Soft. He exhibits no distension and no mass. There is no tenderness. There is no rebound and no guarding.  No focal abdominal tenderness, no RLQ tenderness or pain at McBurney's point, no RUQ tenderness or Murphy's sign, no left-sided abdominal tenderness, no fluid wave, or signs of peritonitis   Musculoskeletal: Normal range of motion. He exhibits no edema and no tenderness.  Neurological: He is alert and oriented to person, place, and time.  Skin: Skin is warm and dry.  Psychiatric: He has a normal mood and affect. His behavior is normal. Judgment and thought content normal.    ED Course  Procedures (including critical care time) Results for orders placed during the hospital encounter of 10/11/13  CBC      Result Value Ref Range   WBC 10.6 (*) 4.0 - 10.5 K/uL   RBC 5.09  4.22 - 5.81 MIL/uL   Hemoglobin 14.7  13.0 - 17.0 g/dL   HCT 42.3  39.0 - 52.0 %   MCV 83.1  78.0 - 100.0 fL   MCH 28.9  26.0 - 34.0 pg   MCHC 34.8  30.0 - 36.0 g/dL   RDW 12.9  11.5 - 15.5 %   Platelets 242  150 - 400 K/uL  BASIC METABOLIC PANEL      Result Value Ref Range   Sodium 137  137 - 147 mEq/L   Potassium 4.2  3.7 - 5.3 mEq/L   Chloride 97  96 - 112 mEq/L   CO2 26  19 - 32 mEq/L   Glucose, Bld 172 (*) 70 - 99 mg/dL   BUN 21  6 - 23 mg/dL   Creatinine, Ser 0.86  0.50 - 1.35 mg/dL   Calcium 9.8  8.4 - 10.5 mg/dL   GFR calc non Af Amer >90  >90 mL/min   GFR calc Af Amer >90  >90 mL/min  URINALYSIS, ROUTINE  W REFLEX MICROSCOPIC      Result Value Ref Range   Color, Urine YELLOW  YELLOW   APPearance CLEAR  CLEAR   Specific Gravity, Urine 1.018  1.005 - 1.030   pH 6.0  5.0 - 8.0   Glucose, UA NEGATIVE  NEGATIVE mg/dL   Hgb urine dipstick NEGATIVE  NEGATIVE   Bilirubin Urine  NEGATIVE  NEGATIVE   Ketones, ur 15 (*) NEGATIVE mg/dL   Protein, ur NEGATIVE  NEGATIVE mg/dL   Urobilinogen, UA 0.2  0.0 - 1.0 mg/dL   Nitrite NEGATIVE  NEGATIVE   Leukocytes, UA NEGATIVE  NEGATIVE  I-STAT TROPOININ, ED      Result Value Ref Range   Troponin i, poc 0.00  0.00 - 0.08 ng/mL   Comment 3            Dg Chest Port 1 View  10/11/2013   CLINICAL DATA Chest pain  EXAM PORTABLE CHEST - 1 VIEW  COMPARISON 07/29/2006  FINDINGS Heart size is upper normal. Negative for heart failure. Lungs are clear without evidence of pneumonia or heart failure. No effusion.  IMPRESSION No active disease.  SIGNATURE  Electronically Signed   By: Franchot Gallo M.D.   On: 10/11/2013 07:13    Imaging Review Dg Chest Port 1 View  10/11/2013   CLINICAL DATA Chest pain  EXAM PORTABLE CHEST - 1 VIEW  COMPARISON 07/29/2006  FINDINGS Heart size is upper normal. Negative for heart failure. Lungs are clear without evidence of pneumonia or heart failure. No effusion.  IMPRESSION No active disease.  SIGNATURE  Electronically Signed   By: Franchot Gallo M.D.   On: 10/11/2013 07:13     EKG Interpretation   Date/Time:  Wednesday October 11 2013 06:50:52 EDT Ventricular Rate:  76 PR Interval:  150 QRS Duration: 88 QT Interval:  366 QTC Calculation: 411 R Axis:   68 Text Interpretation:  Normal sinus rhythm Normal ECG No significant change  since last tracing Confirmed by KNAPP  MD-I, IVA ([id number]) on 10/11/2013  7:06:34 AM      MDM   Final diagnoses:  Chest pain   Patient with chest pain that started this morning.  HEART score is 4.  He has had aspirin.  Will check labs, EKG, CXR.  Anticipate cardiology consult.    Patient discussed with Dr. Eulis Foster.  8:18 AM Cardiology consulted.  10:03 AM Patient seen by Dr. Marlou Porch.  Will admit for obs.    Montine Circle, PA-C 10/11/13 1003

## 2013-10-11 NOTE — H&P (Addendum)
Admit date: 10/11/2013 Primary Physician  Salena Saner., MD Primary Cardiologist  None  CC: Chest pain  HPI: 53 year old male with malignant carcinoid syndrome followed at [facility] as well as Dr. Benay Spice here status post resection of right lung lesion 4/10, diabetes on both insulin and metformin, hypertension, hyperlipidemia, with history of left lateral hepatic artery embolization with drug eluding beats , doxorubicin on 11/12 here with chest pain.  Earlier this morning at approximately 3 AM he had left upper chest/shoulder discomfort described as a dull sensation, radiating to his left arm with palpitations. He was concerned about his skipped beats. He felt a shocklike sensation in his axillary region. 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 No associated shortness of breath. Perhaps he had some minor nausea.   Currently he is stating that he is having minimal/mild discomfort over his left shoulder, dull sensation.  Last week, on 10/03/13, he saw Dr. Einar Gip in the outpatient setting and EKG demonstrated PVCs. I spoke personally to Dr. Einar Gip on telephone. He was set up for a exercise treadmill test and echocardiogram which had not been performed as of yet. His main complaint at that time was palpitations. He was also concerned about possible valvular abnormalities in the setting of carcinoid.  He has previously seen Dr. Domenic Polite according to prior records and had cardiac catheterization on 05/27/2006 by Dr. Burt Knack which revealed normal coronary arteries and ejection fraction of 50%.    PMH:   Past Medical History  Diagnosis Date  . metastatic carcinoid 2008  . Hypertension   . Diabetes mellitus   . Hypercholesteremia   . Chronic pain     r/t cancer    PSH:  History reviewed. No pertinent past surgical history. Allergies:  Review of patient's allergies indicates no known allergies. Prior to Admit Meds:   Prior to Admission medications   Medication Sig Start Date End Date  Taking? Authorizing Provider  ACCU-CHEK FASTCLIX LANCETS [place]  12/29/11  Yes Historical Provider, MD  ACCU-CHEK SMARTVIEW test strip Daily. 09/16/11  Yes Historical Provider, MD  B-D ULTRAFINE III SHORT PEN 31G X 8 MM MISC  09/22/11  Yes Historical Provider, MD  BYDUREON 2 MG SUSR Inject 2 mg into the skin Once a week. 05/17/12  Yes Historical Provider, MD  insulin aspart (NOVOLOG) 100 UNIT/ML injection Inject 10 Units into the skin 3 (three) times daily with meals. Sliding scale for CBG >140 Average dose is 10 units   Yes Historical Provider, MD  LEVEMIR FLEXPEN 100 UNIT/ML injection Inject 60 Units into the skin daily. 40-50 units 09/08/11  Yes Historical Provider, MD  lidocaine-hydrocortisone (ANAMANTEL HC) 3-0.5 % CREA Apply 1 Applicatorful topically 2 (two) times daily as needed (for hemorrhoids).  04/27/12  Yes Historical Provider, MD  metFORMIN (GLUCOPHAGE) 1000 MG tablet Take 1,000 mg by mouth 2 (two) times daily. 06/15/12  Yes Historical Provider, MD  octreotide (SANDOSTATIN LAR DEPOT) 30 MG injection Inject 30 mg into the muscle every 28 (twenty-eight) days.     Yes Historical Provider, MD  Omega-3 Fatty Acids (FISH OIL) 1000 MG CAPS Take 1,000 mg by mouth daily.   Yes Historical Provider, MD  oxyCODONE (OXY IR/ROXICODONE) 5 MG immediate release tablet Take 1 tablet (5 mg total) by mouth every 4 (four) hours as needed for severe pain. 10/09/13  Yes Owens Shark, NP  valsartan-hydrochlorothiazide (DIOVAN-HCT) 160-25 MG per tablet Take 1 tablet by mouth daily.     Yes Historical Provider, MD  vitamin B-12 (CYANOCOBALAMIN) 1000  MCG tablet Take 1,000 mcg by mouth daily.   Yes Historical Provider, MD   Fam HX:   No family history on file. Social HX:    History   Social History  . Marital Status: Married    Spouse Name: N/A    Number of Children: N/A  . Years of Education: N/A   Occupational History  . Not on file.   Social History Main Topics  . Smoking status: Former Smoker    Quit  date: 08/03/2008  . Smokeless tobacco: Not on file  . Alcohol Use: No  . Drug Use: No  . Sexual Activity: Not on file   Other Topics Concern  . Not on file   Social History Narrative  . No narrative on file     ROS:  Positive hesitancy with urination, diarrhea for the past 2 weeks related to carcinoid syndrome. All 11 ROS were addressed and are negative except what is stated in the HPI   Physical Exam: Blood pressure 142/103, pulse 68, temperature 96.8 F (36 C), temperature source Oral, resp. rate 15, height 6' (1.829 m), weight 255 lb (115.667 kg), SpO2 100.00%.   General: Well developed, well nourished, in no acute distress Head: Eyes PERRLA, No xanthomas.   Normal cephalic and atramatic  Lungs:  Clear bilaterally to auscultation and percussion. Normal respiratory effort. No wheezes, no rales. Heart:  HRRR S1 S2 Pulses are 2+ & equal. No murmurs, rubs or gallops.             No carotid bruit. No JVD.  No abdominal bruits. No tenderness over chest wall, no rashes Abdomen: Bowel sounds are positive, abdomen soft and non-tender without masses or                 Hernia's noted. No hepatosplenomegaly. Overweight Msk:  Back normal, normal gait. Normal strength and tone for age. Extremities:  No clubbing, cyanosis or edema.  DP +1 Neuro: Alert and oriented X 3, non-focal, MAE x 4 GU: Deferred Rectal: Deferred Psych:  Good affect, responds appropriately         Labs:   Lab Results  Component Value Date   WBC 10.6* 10/11/2013   HGB 14.7 10/11/2013   HCT 42.3 10/11/2013   MCV 83.1 10/11/2013   PLT 242 10/11/2013    Recent Labs Lab 10/11/13 0707  NA 137  K 4.2  CL 97  CO2 26  BUN 21  CREATININE 0.86  CALCIUM 9.8  GLUCOSE 172*   Troponin-0   Radiology:  Dg Chest Port 1 View  10/11/2013   CLINICAL DATA Chest pain  EXAM PORTABLE CHEST - 1 VIEW  COMPARISON 07/29/2006  FINDINGS Heart size is upper normal. Negative for heart failure. Lungs are clear without evidence of  pneumonia or heart failure. No effusion.  IMPRESSION No active disease.  SIGNATURE  Electronically Signed   By: Franchot Gallo M.D.   On: 10/11/2013 07:13   Personally viewed.   EKG:  10/11/13-sinus rhythm with no other abnormalities. Personally viewed.  Echocardiogram: 05/27/2006:  - Normal ejection fraction  - Normal tricuspid valve/right-sided structures (carcinoid)  ASSESSMENT/PLAN:   53 year old male with malignant carcinoid, hypertension, diabetes, hyperlipidemia with chest pain.  1. Chest pain-somewhat atypical however he does have multitude of factors including diabetes, hypertension, hyperlipidemia. I have discussed his case with Dr. Einar Gip, his primary cardiologist. The plan is to observe him overnight and if cardiac markers, EKG remain unremarkable, he will proceed with outpatient stress test evaluation and  ECHO at Dr. Irven Shelling office. Differential diagnosis includes musculoskeletal discomfort, neuropathic discomfort. Will start low dose Bb.  2. Diabetes-we will hold metformin. I will cut his Levemir dosage from 60 units daily down to 30 units while in hospital. Continue with sliding scale insulin. 3. Malignant carcinoid-as reviewed above, previous hepatic lesions. 4. Obesity-continue to encourage weight loss 5. Hyperlipidemia-likely not on statin because of previous liver involvement of carcinoid 6. Hypertension-continue with current medication. Most recent blood pressure within normal range.  Candee Furbish, MD  10/11/2013  9:19 AM

## 2013-10-11 NOTE — ED Notes (Signed)
Pt states chest pain starting around 0300 this morning.  Pt states he took a bayer asprin "pack" at home.

## 2013-10-11 NOTE — Progress Notes (Signed)
Patient sinus rhythm with PVC's.  He states he is able to feel his heart fluttering and would like to speak with physician concerning being symptomatic with his PVC's.  Denies any further chest pain at this time. Will continue to monitor.  Sanda Linger

## 2013-10-11 NOTE — ED Provider Notes (Signed)
  Face-to-face evaluation   History: He reports, chest pain this morning that radiates to his left arm, as a dull sensation. He also has a sensation of palpitations, today. He has had several other symptoms similar to this recently. He saw a cardiologist and has been scheduled for a stress test, this month. His pain is 3/10  Physical exam: Alert, anxious, appearing, and minimal discomfort. Heart- regular rhythm, no murmur. Lungs clear to auscult. Patient- chest nontender to palpation.  abdomen has diffuse, mild tenderness without rebound, tenderness. No leg edema  Medical screening examination/treatment/procedure(s) were conducted as a shared visit with non-physician practitioner(s) and myself.  I personally evaluated the patient during the encounter  Richarda Blade, MD 10/13/13 1105

## 2013-10-12 ENCOUNTER — Observation Stay (HOSPITAL_COMMUNITY): Payer: BC Managed Care – PPO

## 2013-10-12 DIAGNOSIS — R002 Palpitations: Secondary | ICD-10-CM

## 2013-10-12 DIAGNOSIS — R0789 Other chest pain: Secondary | ICD-10-CM

## 2013-10-12 LAB — CBC
HEMATOCRIT: 40.8 % (ref 39.0–52.0)
HEMOGLOBIN: 14 g/dL (ref 13.0–17.0)
MCH: 28.7 pg (ref 26.0–34.0)
MCHC: 34.3 g/dL (ref 30.0–36.0)
MCV: 83.6 fL (ref 78.0–100.0)
Platelets: 202 10*3/uL (ref 150–400)
RBC: 4.88 MIL/uL (ref 4.22–5.81)
RDW: 13.2 % (ref 11.5–15.5)
WBC: 7.9 10*3/uL (ref 4.0–10.5)

## 2013-10-12 LAB — BASIC METABOLIC PANEL
BUN: 17 mg/dL (ref 6–23)
CO2: 28 meq/L (ref 19–32)
CREATININE: 0.88 mg/dL (ref 0.50–1.35)
Calcium: 9.1 mg/dL (ref 8.4–10.5)
Chloride: 100 mEq/L (ref 96–112)
GFR calc Af Amer: >90 mL/min (ref >90)
GFR calc non Af Amer: >90 mL/min (ref >90)
GLUCOSE: 221 mg/dL — AB (ref 70–99)
POTASSIUM: 4.3 meq/L (ref 3.7–5.3)
Sodium: 141 mEq/L (ref 137–147)

## 2013-10-12 LAB — GLUCOSE, CAPILLARY
GLUCOSE-CAPILLARY: 163 mg/dL — AB (ref 70–99)
GLUCOSE-CAPILLARY: 201 mg/dL — AB (ref 70–99)
Glucose-Capillary: 182 mg/dL — ABNORMAL HIGH (ref 70–99)

## 2013-10-12 LAB — LIPID PANEL
Cholesterol: 144 mg/dL (ref 0–200)
HDL: 36 mg/dL — ABNORMAL LOW (ref >39)
LDL CALC: 68 mg/dL (ref 0–99)
TRIGLYCERIDES: 200 mg/dL — AB (ref <150)
Total CHOL/HDL Ratio: 4 RATIO
VLDL: 40 mg/dL (ref 0–40)

## 2013-10-12 LAB — TROPONIN I: Troponin I: <0.3 ng/mL (ref <0.30)

## 2013-10-12 MED ORDER — ASPIRIN 81 MG PO TBEC: 81.0000 mg | DELAYED_RELEASE_TABLET | Freq: Every day | ORAL | Status: AC

## 2013-10-12 MED ORDER — TECHNETIUM TC 99M SESTAMIBI GENERIC - CARDIOLITE
10.0000 | Freq: Once | INTRAVENOUS | Status: AC | PRN
  Administered 2013-10-12: 10 via INTRAVENOUS

## 2013-10-12 MED ORDER — TECHNETIUM TC 99M SESTAMIBI GENERIC - CARDIOLITE
30.0000 | Freq: Once | INTRAVENOUS | Status: AC | PRN
  Administered 2013-10-12: 30 via INTRAVENOUS

## 2013-10-12 NOTE — Progress Notes (Signed)
UR completed 

## 2013-10-12 NOTE — Progress Notes (Signed)
Inpatient Diabetes Program Recommendations  AACE/ADA: New Consensus Statement on Inpatient Glycemic Control (2013)  Target Ranges:  Prepandial:   less than 140 mg/dL      Peak postprandial:   less than 180 mg/dL (1-2 hours)      Critically ill patients:  140 - 180 mg/dL   Inpatient Diabetes Program Recommendations Correction (SSI): Add Novolog sensitive scale TID + HS scale per Glycemic Control order set Thank you  Raoul Pitch BSN, RN,CDE Inpatient Diabetes Coordinator [phone] (team pager)

## 2013-10-12 NOTE — Progress Notes (Signed)
Pt. Seen and examined. Agree with the NP/PA-C note as written.  He is still having 1-2/10 chest discomfort - not radiating to his arm. He did report nitrate responsive chest pain and left arm pain. He has ruled-out for MI, but his symptoms are very concerning for angina and he has numerous risk factors, including a family history of premature CAD. He did have a "normal cath" in 2008, but that does not seem relevant at this time. I would not feel comfortable sending him home for an outpatient stress test in 2 weeks. We discussed options and he is willing to proceed with exercise myoview today. If low risk, could be discharged later and follow-up with Dr. Einar Gip.  Pixie Casino, MD, [facility] Attending Cardiologist [place]

## 2013-10-12 NOTE — Progress Notes (Signed)
Pt discharged to home per MD order. Pt received and reviewed all discharge instructions and medication information including follow-up appointments and prescription information. Pt verbalized understanding. Pt IV and telemetry box removed prior to discharge. Pt ambulated to private vehicle per pt request. Frank Pennington

## 2013-10-12 NOTE — Progress Notes (Signed)
Subjective: No CP currently but has some nagging symptoms.  Objective: Vital signs in last 24 hours: Temp:  [97.5 F (36.4 C)-97.9 F (36.6 C)] 97.6 F (36.4 C) (03/12 0516) Pulse Rate:  [63-82] 63 (03/12 0516) Resp:  [10-18] 18 (03/12 0516) BP: (98-126)/(66-85) 110/73 mmHg (03/12 0516) SpO2:  [93 %-100 %] 97 % (03/12 0516) Weight:  [255 lb 3.2 oz (115.758 kg)] 255 lb 3.2 oz (115.758 kg) (03/12 0516) Last BM Date: 10/11/13  Intake/Output from previous day: 03/11 0701 - 03/12 0700 In: 300 [P.O.:300] Out: -  Intake/Output this shift:    Medications Current Facility-Administered Medications  Medication Dose Route Frequency Provider Last Rate Last Dose  . aspirin EC tablet 81 mg  81 mg Oral Daily Candee Furbish, MD      . heparin injection 5,000 Units  5,000 Units Subcutaneous 3 times per day Candee Furbish, MD   5,000 Units at 10/12/13 0554  . irbesartan (AVAPRO) tablet 150 mg  150 mg Oral Daily Kimberly Ballard Hammons, RPH       And  . hydrochlorothiazide (HYDRODIURIL) tablet 25 mg  25 mg Oral Daily Rocky Crafts Hammons, RPH      . insulin aspart (novoLOG) injection 10 Units  10 Units Subcutaneous TID WC Candee Furbish, MD   10 Units at 10/11/13 1336  . insulin detemir (LEVEMIR) injection 30 Units  30 Units Subcutaneous Daily Candee Furbish, MD      . metoprolol tartrate (LOPRESSOR) tablet 12.5 mg  12.5 mg Oral BID Candee Furbish, MD   12.5 mg at 10/11/13 2202  . nitroGLYCERIN (NITROSTAT) SL tablet 0.4 mg  0.4 mg Sublingual Q5 Min x 3 PRN Montine Circle, PA-C   0.4 mg at 10/11/13 1037  . oxyCODONE (Oxy IR/ROXICODONE) immediate release tablet 5 mg  5 mg Oral Q4H PRN Candee Furbish, MD   5 mg at 10/11/13 1033    PE: General appearance: alert, cooperative and no distress Lungs: clear to auscultation bilaterally Heart: regular rate and rhythm, S1, S2 normal, no murmur, click, rub or gallop Extremities: no LEE Pulses: 2+ and symmetric Skin: Warm and dry Neurologic: Grossly  normal  Lab Results:   Recent Labs  10/11/13 0707 10/11/13 1305 10/12/13 0035  WBC 10.6* 8.2 7.9  HGB 14.7 14.3 14.0  HCT 42.3 41.1 40.8  PLT 242 221 202   BMET  Recent Labs  10/11/13 0707 10/11/13 1305 10/12/13 0035  NA 137  --  141  K 4.2  --  4.3  CL 97  --  100  CO2 26  --  28  GLUCOSE 172*  --  221*  BUN 21  --  17  CREATININE 0.86 0.80 0.88  CALCIUM 9.8  --  9.1   PT/INR  Recent Labs  10/11/13 1305  LABPROT 13.2  INR 1.02   Cholesterol  Recent Labs  10/12/13 0035  CHOL 144   Lipid Panel     Component Value Date/Time   CHOL 144 10/12/2013 0035   TRIG 200* 10/12/2013 0035   HDL 36* 10/12/2013 0035   CHOLHDL 4.0 10/12/2013 0035   VLDL 40 10/12/2013 0035   LDLCALC 68 10/12/2013 0035    Cardiac Panel (last 3 results)  Recent Labs  10/11/13 1305 10/11/13 1917 10/12/13 0035  TROPONINI <0.30 <0.30 <0.30    Assessment/Plan  Principal Problem:   Chest pain Active Problems:   Malignant carcinoid syndrome   Hypertension   Hyperlipidemia   Obesity   Palpitations  Plan: Ruled  out for MI.   I am concerned about his chest and left arm pain, which woke him from sleep and was quickly responsive to nitro.  IP treadmill myoview my be a better choice.    LDL appropriate.  Was on lipitor up until two weeks ago when his LFTs were found to be elevated and it was stopped..  Triglycerides are 200.  He takes fish oil at home.  Needs a lower carb diet which we discussed.  Apparently he's seen a dietician, however, diabetes is uncontrolled.   A1C 10.5.    BP and HR controlled.  Some PVCs on tele.    LOS: 1 day    Frank Pennington 10/12/2013 8:35 AM

## 2013-10-18 NOTE — Discharge Summary (Signed)
Physician Discharge Summary     Patient ID: Frank Pennington MRN: [id number] DOB/AGE: 01/09/1961 53 y.o.  Admit date: 10/11/2013 Discharge date: 10/18/2013  Admission Diagnoses:  Chest pain  Discharge Diagnoses:  Principal Problem:   Chest pain Active Problems:   Malignant carcinoid syndrome   Hypertension   Hyperlipidemia   Obesity   Palpitations   Discharged Condition: stable  Hospital Course:  53 year old male with malignant carcinoid syndrome followed at [facility] as well as Dr. Benay Spice here status post resection of right lung lesion 4/10, diabetes on both insulin and metformin, hypertension, hyperlipidemia, with history of left lateral hepatic artery embolization with drug eluding beats , doxorubicin on 11/12 here with chest pain.   Earlier this morning at approximately 3 AM he had left upper chest/shoulder discomfort described as a dull sensation, radiating to his left arm with palpitations. He was concerned about his skipped beats. He felt a shocklike sensation in his axillary region. 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 No associated shortness of breath. Perhaps he had some minor nausea.   Currently he is stating that he is having minimal/mild discomfort over his left shoulder, dull sensation.  Last week, on 10/03/13, he saw Dr. Einar Gip in the outpatient setting and EKG demonstrated PVCs. I spoke personally to Dr. Einar Gip on telephone. He was set up for a exercise treadmill test and echocardiogram which had not been performed as of yet. His main complaint at that time was palpitations. He was also concerned about possible valvular abnormalities in the setting of carcinoid.   He has previously seen Dr. Domenic Polite according to prior records and had cardiac catheterization on 05/27/2006 by Dr. Burt Knack which revealed normal coronary arteries and ejection fraction of 50%.  The patient was admitted.  He ruled out for MI and underwent a treadmill myovue which revealed no ischemia and an EF  of 71%.  Hgb A1C was 10.5. The diabetes coordinator saw the patient. The patient was seen by Dr. Debara Pickett who felt he was stable for DC home.   Consults:  Diabetes Coordinator  Significant Diagnostic Studies:  EXAM: MYOCARDIAL IMAGING WITH SPECT (REST AND EXERCISE)  GATED LEFT VENTRICULAR WALL MOTION STUDY  LEFT VENTRICULAR EJECTION FRACTION  TECHNIQUE: Standard myocardial SPECT imaging was performed after resting intravenous injection of 10 mCi Tc-4m sestamibi. Subsequently, exercise tolerance test was performed by the patient under the supervision of the Cardiology staff. At peak-stress, 30 mCi Tc-8m sestamibi was injected intravenously and standard myocardial SPECT imaging was performed. Quantitative gated imaging was also performed to evaluate left ventricular wall motion, and estimate left ventricular ejection fraction.  COMPARISON: Chest radiograph 10/11/2013  FINDINGS: The myocardial perfusion is normal on the stress images. There is no evidence for reversibility or infarct.  The wall motion is normal. Calculated ejection fraction is 71%. End-diastolic volume is 68 ml and end systolic volume is 20 mL.  IMPRESSION: Normal examination. No evidence for stress-induced ischemia.  Calculated ejection fraction is 71%.   Treatments: See above  Discharge Exam: Blood pressure 134/66, pulse 160, temperature 97.6 F (36.4 C), temperature source Oral, resp. rate 18, height 6' (1.829 m), weight 255 lb 3.2 oz (115.758 kg), SpO2 97.00%.   Disposition: 01-Home or Self Care  Discharge Orders   Future Appointments Provider Department Dept Phone   10/26/2013 8:30 AM Ladell Pier, MD [facility] [phone]   Future Orders Complete By Expires   Diet - low sodium heart healthy  As directed    Increase  activity slowly  As directed        Medication List         ACCU-CHEK FASTCLIX LANCETS Misc     ACCU-CHEK SMARTVIEW test strip  Generic  drug:  glucose blood  Daily.     aspirin 81 MG EC tablet  Take 1 tablet (81 mg total) by mouth daily.     B-D ULTRAFINE III SHORT PEN 31G X 8 MM Misc  Generic drug:  Insulin Pen Needle     BYDUREON 2 MG Susr  Generic drug:  Exenatide ER  Inject 2 mg into the skin Once a week.     Fish Oil 1000 MG Caps  Take 1,000 mg by mouth daily.     insulin aspart 100 UNIT/ML injection  Commonly known as:  novoLOG  - Inject 10 Units into the skin 3 (three) times daily with meals. Sliding scale for CBG >140  - Average dose is 10 units     LEVEMIR FLEXPEN 100 UNIT/ML Pen  Generic drug:  Insulin Detemir  Inject 60 Units into the skin daily. 40-50 units     lidocaine-hydrocortisone 3-0.5 % Crea  Commonly known as:  ANAMANTEL HC  Apply 1 Applicatorful topically 2 (two) times daily as needed (for hemorrhoids).     metFORMIN 1000 MG tablet  Commonly known as:  GLUCOPHAGE  Take 1,000 mg by mouth 2 (two) times daily.     oxyCODONE 5 MG immediate release tablet  Commonly known as:  Oxy IR/ROXICODONE  Take 1 tablet (5 mg total) by mouth every 4 (four) hours as needed for severe pain.     SANDOSTATIN LAR DEPOT 30 MG injection  Generic drug:  octreotide  Inject 30 mg into the muscle every 28 (twenty-eight) days.     valsartan-hydrochlorothiazide 160-25 MG per tablet  Commonly known as:  DIOVAN-HCT  Take 1 tablet by mouth daily.     vitamin B-12 1000 MCG tablet  Commonly known as:  CYANOCOBALAMIN  Take 1,000 mcg by mouth daily.         SignedTarri Fuller 10/18/2013, 8:48 AM

## 2013-10-26 ENCOUNTER — Telehealth: Payer: Self-pay | Admitting: Oncology

## 2013-10-26 ENCOUNTER — Ambulatory Visit (HOSPITAL_BASED_OUTPATIENT_CLINIC_OR_DEPARTMENT_OTHER): Payer: BC Managed Care – PPO | Admitting: Oncology

## 2013-10-26 VITALS — BP 126/86 | HR 84 | Temp 97.6°F | Resp 18 | Ht 72.0 in | Wt 253.2 lb

## 2013-10-26 DIAGNOSIS — C787 Secondary malignant neoplasm of liver and intrahepatic bile duct: Secondary | ICD-10-CM

## 2013-10-26 DIAGNOSIS — R109 Unspecified abdominal pain: Secondary | ICD-10-CM

## 2013-10-26 DIAGNOSIS — R197 Diarrhea, unspecified: Secondary | ICD-10-CM

## 2013-10-26 DIAGNOSIS — C7A00 Malignant carcinoid tumor of unspecified site: Secondary | ICD-10-CM

## 2013-10-26 DIAGNOSIS — E340 Carcinoid syndrome: Secondary | ICD-10-CM

## 2013-10-26 NOTE — Telephone Encounter (Signed)
gv and printed appts sched and avs for pt for May

## 2013-10-26 NOTE — Progress Notes (Signed)
[place] OFFICE PROGRESS NOTE   Diagnosis: metastatic carcinoid tumor  INTERVAL HISTORY:   Frank Pennington has multiple complaints today. He reports increased abdominal pain and diarrhea. He has 6-8 bowel movements per day. He decrease the Sandostatin to a 6-8 week interval.  He was admitted on 10/11/2013 with chest pain.he underwent a negative cardiac evaluation. He is scheduled for an echocardiogram later today.  He saw Dr. Leamon Arnt on 10/24/2013. A restaging CT confirmed an increase in the size of multiple hepatic lesions and new lesions. There was also an increase in mediastinal and abdominal lymphadenopathy.a subcutaneous density was noted at the right lateral chest wall.he reports Dr. Leamon Arnt is considering additional hepatic directed therapy.  Frank Pennington  Objective:  Vital signs in last 24 hours:  Blood pressure 126/86, pulse 84, temperature 97.6 F (36.4 C), temperature source Oral, resp. rate 18, height 6' (1.829 m), weight 253 lb 3.2 oz (114.851 kg).    HEENT: neck without mass Resp: lungs clear bilaterally Cardio: regular rate and rhythm GI: fullness in the right mid abdomen without a discrete liver edge. Tenderness in the right mid abdomen. No splenomegaly Vascular: no leg edema.  Skin:soft 2 cm cutaneous nodular lesion at the right lateral chest wall     Lab Results:  Lab Results  Component Value Date   WBC 7.9 10/12/2013   HGB 14.0 10/12/2013   HCT 40.8 10/12/2013   MCV 83.6 10/12/2013   PLT 202 10/12/2013   NEUTROABS 6.0 09/02/2006   Chromogranin on 10/24/2013 at [id number]  Imaging:  No results found.  Medications: I have reviewed the patient's current medications.  Assessment/Plan: 1. Metastatic carcinoid tumor, status post treatment at [facility] on [facility] study with progressive disease noted on a restaging CT 06/25/2008. He is status post SIR-Spheres therapy at [facility] in December 2009. A restaging CT 03/22/2009 confirmed a decrease in the  metastatic liver lesions compared to the November 2009 CT.  a. Restaging CT 09/04/2009 at [facility] confirmed progressive disease in the liver. b. Initiation of sorafenib therapy February 2011.  c. Restaging CT June 2011 with progressive disease in the liver. d. Initiation of Avastin/Xeloda June 2011  e. Initiation of Nexavar 08/14/2010 after a CT of the chest, abdomen and pelvis confirmed enlargement of liver lesions and a mesenteric mass. f. Restaging CT 04/14/2011 showed enlargement hepatic masses and a minimal enlargement of the mesenteric mass g. Right hepatic artery embolization with drug-eluting beads 05/15/2011 h. Left lateral hepatic artery embolization with drug-eluting beads (doxorubicin) 06/15/2011 i. Restaging CT 09/07/2011 showed an increase in the size of several hepatic lesion and a decrease in other lesions. The mesenteric mass was slightly larger j. Restaging CT at [facility] on 12/07/2011 reveals stable hepatic metastases k. Restaging CT at [facility] on 06/13/2012 reveal overall improvement in the multiple hypoattenuating liver lesions with 2 hyperattenuating lesions felt to be new versus increased in conspicuity do to differences in phase enhancement l. Restaging CT at [facility] on 12/12/2012-no significant change in the metastatic tumor burden, minimal increase in the calcified mesenteric mass m. Restaging CT at [facility] 10/24/2013 with progressive liver metastases and chest/abdominal lymphadenopathy, markedly elevated chromogranin 10/24/2013 2. Carcinoid syndrome,initially controlled with monthly Sandostatin. 3. Pain secondary to metastatic disease involving the liver, improved, no longer taking MS Contin 4. Hypertension. 5. Hypercholesterolemia 6. History of Facial flushing and a "hot" sensation, potentially related to niacin. 7. History of Helicobacter pylori gastritis. 8. History of rectal bleeding. 9. Diabetes-on insulin and metformin 10. Lumbar osteomyelitis in December 2008.  11. Status post  resection of a right lung lesion in April 2010 with pathology revealing only a microscopic focus of carcinoid tumor in the submitted tissue.  12. Erectile dysfunction 13. Admission with chest pain 10/11/2013-negative for evaluation   Disposition:  Frank Pennington has progressive metastatic carcinoid tumor. He has increased diarrhea. I recommended he go back to monthly Sandostatin injections. He will followup with Dr. Leamon Arnt to decide on treatment options. He will discuss increasing the dose of Sandostatin and hepatic directed therapy with Dr. Leamon Arnt.  Frank Pennington will return for an office visit here in 2 months. We will see him sooner as needed.  Betsy Coder, MD  10/26/2013  8:41 PM

## 2013-11-27 ENCOUNTER — Other Ambulatory Visit: Payer: Self-pay | Admitting: *Deleted

## 2013-11-27 DIAGNOSIS — E340 Carcinoid syndrome: Secondary | ICD-10-CM

## 2013-11-27 MED ORDER — OXYCODONE HCL 5 MG PO TABS: 5.0000 mg | ORAL_TABLET | ORAL | Status: DC | PRN

## 2013-11-27 NOTE — Telephone Encounter (Signed)
Message from pt requesting refill on Oxycodone. Rx will be left in prescription book for pick up.  

## 2013-12-26 ENCOUNTER — Telehealth: Payer: Self-pay | Admitting: Oncology

## 2013-12-26 NOTE — Telephone Encounter (Signed)
pt called to r/s appt to sept due to having tx at [facility]...done...pt aware of new d.t

## 2013-12-28 ENCOUNTER — Ambulatory Visit: Payer: BC Managed Care – PPO | Admitting: Oncology

## 2014-01-01 ENCOUNTER — Other Ambulatory Visit: Payer: Self-pay | Admitting: *Deleted

## 2014-01-01 DIAGNOSIS — E340 Carcinoid syndrome: Secondary | ICD-10-CM

## 2014-01-01 MED ORDER — OXYCODONE HCL 5 MG PO TABS: 5.0000 mg | ORAL_TABLET | ORAL | Status: DC | PRN

## 2014-01-01 NOTE — Telephone Encounter (Signed)
Called patient that script is ready for pick up.

## 2014-01-31 ENCOUNTER — Other Ambulatory Visit: Payer: Self-pay | Admitting: *Deleted

## 2014-01-31 DIAGNOSIS — E340 Carcinoid syndrome: Secondary | ICD-10-CM

## 2014-01-31 MED ORDER — OXYCODONE HCL 5 MG PO TABS: 5.0000 mg | ORAL_TABLET | ORAL | Status: DC | PRN

## 2014-01-31 NOTE — Telephone Encounter (Signed)
Message from pt requesting refill on Oxycodone. Reports he had his embolization surgery done. Rx will be left in prescription book for pick up.

## 2014-03-05 ENCOUNTER — Other Ambulatory Visit: Payer: Self-pay | Admitting: *Deleted

## 2014-03-05 DIAGNOSIS — E340 Carcinoid syndrome: Secondary | ICD-10-CM

## 2014-03-05 MED ORDER — OXYCODONE HCL 5 MG PO TABS: 5.0000 mg | ORAL_TABLET | ORAL | Status: DC | PRN

## 2014-03-05 NOTE — Telephone Encounter (Signed)
Message from pt requesting refill on pain meds. Rx will be left in prescription book for pick up.

## 2014-04-16 ENCOUNTER — Other Ambulatory Visit: Payer: Self-pay | Admitting: *Deleted

## 2014-04-16 ENCOUNTER — Telehealth: Payer: Self-pay | Admitting: *Deleted

## 2014-04-16 DIAGNOSIS — E340 Carcinoid syndrome: Secondary | ICD-10-CM

## 2014-04-16 MED ORDER — OXYCODONE HCL 5 MG PO TABS: 5.0000 mg | ORAL_TABLET | ORAL | Status: DC | PRN

## 2014-04-16 NOTE — Telephone Encounter (Signed)
Notified pt that pain request re-fill is ready for pick-up today; instructed pt to arrive anytime before 4:15.  Pt verbalized understanding and expressed appreciation for call back.

## 2014-05-01 ENCOUNTER — Ambulatory Visit (HOSPITAL_BASED_OUTPATIENT_CLINIC_OR_DEPARTMENT_OTHER): Payer: BC Managed Care – PPO | Admitting: Oncology

## 2014-05-01 VITALS — BP 142/92 | HR 69 | Temp 98.4°F | Resp 18 | Ht 72.0 in | Wt 216.5 lb

## 2014-05-01 DIAGNOSIS — Z23 Encounter for immunization: Secondary | ICD-10-CM

## 2014-05-01 DIAGNOSIS — E340 Carcinoid syndrome: Secondary | ICD-10-CM

## 2014-05-01 DIAGNOSIS — C7A00 Malignant carcinoid tumor of unspecified site: Secondary | ICD-10-CM

## 2014-05-01 DIAGNOSIS — C787 Secondary malignant neoplasm of liver and intrahepatic bile duct: Secondary | ICD-10-CM

## 2014-05-01 MED ORDER — OXYCODONE HCL ER 10 MG PO T12A: 10.0000 mg | EXTENDED_RELEASE_TABLET | Freq: Two times a day (BID) | ORAL | Status: DC

## 2014-05-01 MED ORDER — INFLUENZA VAC SPLIT QUAD 0.5 ML IM SUSY
0.5000 mL | PREFILLED_SYRINGE | Freq: Once | INTRAMUSCULAR | Status: AC
  Administered 2014-05-01: 0.5 mL via INTRAMUSCULAR
  Filled 2014-05-01: qty 0.5

## 2014-05-01 NOTE — Progress Notes (Signed)
[place] OFFICE PROGRESS NOTE   Diagnosis: Carcinoid tumor  INTERVAL HISTORY:   Mr. Carsten returns as scheduled. He continues monthly Sandostatin. No diarrhea. He reports increased right abdominal pain. He would like to resume OxyContin. He has decreased the insulin dose secondary to weight loss. He reports abdominal pain after eating prompting a decrease in his food intake.  Mr. Leamon Arnt and continues close followup with Dr. Leamon Arnt. Restaging CT scans on 03/12/2012 revealed progressive disease in the liver and a new left lower lobe nodule. There was also enlargement of cardiophrenic, mesenteric, and periaortic lymph nodes.  He is scheduled to see Dr. Leamon Arnt later today to discuss enrollment on a clinical trial with fosbretabulin.  Objective:  Vital signs in last 24 hours:  Blood pressure 142/92, pulse 69, temperature 98.4 F (36.9 C), temperature source Oral, resp. rate 18, height 6' (1.829 m), weight 216 lb 8 oz (98.204 kg).    Resp: Lungs clear bilaterally Cardio: Regular rate and rhythm GI: Fullness throughout the right upper abdomen without a discrete liver edge Vascular: No leg edema    Lab Results:  Chromogranin A on 03/12/2014-4274   Medications: I have reviewed the patient's current medications.  Assessment/Plan: 1. Metastatic carcinoid tumor, status post treatment at [facility] on [facility] study with progressive disease noted on a restaging CT 06/25/2008. He is status post SIR-Spheres therapy at [facility] in December 2009. A restaging CT 03/22/2009 confirmed a decrease in the metastatic liver lesions compared to the November 2009 CT.  a. Restaging CT 09/04/2009 at [facility] confirmed progressive disease in the liver. b. Initiation of sorafenib therapy February 2011.  c. Restaging CT June 2011 with progressive disease in the liver. d. Initiation of Avastin/Xeloda June 2011  e. Initiation of Nexavar 08/14/2010 after a CT of the chest, abdomen and pelvis confirmed  enlargement of liver lesions and a mesenteric mass. f. Restaging CT 04/14/2011 showed enlargement hepatic masses and a minimal enlargement of the mesenteric mass g. Right hepatic artery embolization with drug-eluting beads 05/15/2011 h. Left lateral hepatic artery embolization with drug-eluting beads (doxorubicin) 06/15/2011 i. Restaging CT 09/07/2011 showed an increase in the size of several hepatic lesion and a decrease in other lesions. The mesenteric mass was slightly larger j. Restaging CT at [facility] on 12/07/2011 reveals stable hepatic metastases k. Restaging CT at [facility] on 06/13/2012 reveal overall improvement in the multiple hypoattenuating liver lesions with 2 hyperattenuating lesions felt to be new versus increased in conspicuity do to differences in phase enhancement l. Restaging CT at [facility] on 12/12/2012-no significant change in the metastatic tumor burden, minimal increase in the calcified mesenteric mass m. Restaging CT at [facility] 10/24/2013 with progressive liver metastases and chest/abdominal lymphadenopathy, markedly elevated chromogranin 10/24/2013 n. Chemoembolization of the right hepatic artery  12/19/2013 o. Chemoembolization of the left hepatic artery 01/29/2014 2. Carcinoid syndrome,controlled with monthly Sandostatin. 3. Pain secondary to metastatic disease involving the liver, improved, no longer taking MS Contin 4. Hypertension. 5. Hypercholesterolemia 6. History of Facial flushing and a "hot" sensation, potentially related to niacin. 7. History of Helicobacter pylori gastritis. 8. History of rectal bleeding. 9. Diabetes-on insulin and metformin 10. Lumbar osteomyelitis in December 2008. 11. Status post resection of a right lung lesion in April 2010 with pathology revealing only a microscopic focus of carcinoid tumor in the submitted tissue.  12. Erectile dysfunction 13. Admission with chest pain 10/11/2013-negative cardiac evaluation 14. Weight loss secondary to progressive  carcinoid tumor   Disposition:  Ms. Schetter has clinical and x-ray  evidence of disease progression. We discussed treatment options. He is considering enrollment on a clinical trial with Dr. Leamon Arnt. He is scheduled for an appointment at [facility] later today.  He has lost a significant amount of weight. I recommended he contact Dr. Karlton Lemon to discuss adjusting the diabetic regimen.  He has postprandial pain. This may be related to the mesenteric mass. The CT did not show evidence of bowel obstruction.  We added OxyContin at a dose of 10 mg twice daily. He will return for an office visit here in 2 months. He received an influenza vaccine today.  Betsy Coder, MD  05/01/2014  8:59 AM

## 2014-05-02 ENCOUNTER — Telehealth: Payer: Self-pay | Admitting: Oncology

## 2014-05-02 NOTE — Telephone Encounter (Signed)
s.w. pt wife and advised on NOV appt....ok and aware °

## 2014-05-18 ENCOUNTER — Other Ambulatory Visit: Payer: Self-pay

## 2014-05-21 ENCOUNTER — Other Ambulatory Visit: Payer: Self-pay | Admitting: *Deleted

## 2014-05-21 DIAGNOSIS — E340 Carcinoid syndrome: Secondary | ICD-10-CM

## 2014-05-21 MED ORDER — OXYCODONE HCL 5 MG PO TABS: 5.0000 mg | ORAL_TABLET | ORAL | Status: DC | PRN

## 2014-05-21 NOTE — Telephone Encounter (Signed)
Requests refill on oxycodone. Wife will pick up today.

## 2014-06-06 ENCOUNTER — Other Ambulatory Visit: Payer: Self-pay | Admitting: *Deleted

## 2014-06-06 DIAGNOSIS — E340 Carcinoid syndrome: Secondary | ICD-10-CM

## 2014-06-06 MED ORDER — OXYCODONE HCL 5 MG PO TABS: 5.0000 mg | ORAL_TABLET | ORAL | Status: AC | PRN

## 2014-06-06 MED ORDER — OXYCODONE HCL ER 10 MG PO T12A: 10.0000 mg | EXTENDED_RELEASE_TABLET | Freq: Two times a day (BID) | ORAL | Status: AC

## 2014-06-06 NOTE — Telephone Encounter (Signed)
Needs refill on Oxycontin and Percocet. Will pick up when ready.

## 2014-06-18 ENCOUNTER — Telehealth: Payer: Self-pay | Admitting: Dietician

## 2014-06-18 NOTE — Telephone Encounter (Signed)
Brief Outpatient Oncology Nutrition Note  Patient has been identified to be at risk on malnutrition screen.  Wt Readings from Last 10 Encounters:  05/01/14 216 lb 8 oz (98.204 kg)  10/26/13 253 lb 3.2 oz (114.851 kg)  10/12/13 255 lb 3.2 oz (115.758 kg)  12/19/12 268 lb 3.2 oz (121.655 kg)  06/20/12 276 lb 8 oz (125.42 kg)  03/08/12 269 lb 11.2 oz (122.335 kg)  09/22/11 260 lb 11.2 oz (118.253 kg)    Dx:  Carcinoid tumor, mets, Patient of Dr. Benay Spice.  Receiving some tx at [facility].  Spoke with patient's wife due to patient's weight loss.  Patient reports that patient limits his intake some due to bloating with meals.  Drinks 1 Ensure daily.  Questions regarding blood pressure and diet answered.  Encouraged increased intake with minimal restrictions to prevent further weight loss.  Educated on good protein sources.    Weight per wife is now about 198 lbs.  Patient with a 25% weight loss in the last 6 months.  Will mail supplement coupons along with recipes, tips to increase calories and protein, and contact information for the [place] [address].  Antonieta Iba, RD, LDN

## 2014-06-25 ENCOUNTER — Telehealth: Payer: Self-pay | Admitting: Oncology

## 2014-06-25 NOTE — Telephone Encounter (Signed)
returned pt call and r/s appt .Frank Pennington..pt awareof new d.t

## 2014-06-26 ENCOUNTER — Ambulatory Visit: Payer: Medicare Other | Admitting: Oncology

## 2014-08-13 ENCOUNTER — Ambulatory Visit (HOSPITAL_BASED_OUTPATIENT_CLINIC_OR_DEPARTMENT_OTHER): Payer: BLUE CROSS/BLUE SHIELD | Admitting: Oncology

## 2014-08-13 ENCOUNTER — Telehealth: Payer: Self-pay | Admitting: Oncology

## 2014-08-13 VITALS — BP 124/92 | HR 101 | Temp 98.0°F | Resp 19 | Ht 72.0 in | Wt 213.3 lb

## 2014-08-13 DIAGNOSIS — C787 Secondary malignant neoplasm of liver and intrahepatic bile duct: Secondary | ICD-10-CM

## 2014-08-13 DIAGNOSIS — E340 Carcinoid syndrome: Secondary | ICD-10-CM

## 2014-08-13 DIAGNOSIS — R634 Abnormal weight loss: Secondary | ICD-10-CM

## 2014-08-13 DIAGNOSIS — G893 Neoplasm related pain (acute) (chronic): Secondary | ICD-10-CM

## 2014-08-13 DIAGNOSIS — C7A00 Malignant carcinoid tumor of unspecified site: Secondary | ICD-10-CM

## 2014-08-13 NOTE — Progress Notes (Signed)
[place] OFFICE PROGRESS NOTE   Diagnosis: Carcinoid tumor  INTERVAL HISTORY:   [name] returns as scheduled. He is enrolled on a clinical trial at [facility] with Fosbretabulin. He reports he is scheduled for restaging scans in February. Frank Pennington was admitted to [facility] last week with fever and hypotension. He reports being diagnosed with "prostatitis "and is now taking ciprofloxacin. He was discharged from the hospital yesterday.  He continues to have abdominal pain. The OxyContin was increased to 30 mg. He continues oxycodone for breakthrough pain.  Objective:  Vital signs in last 24 hours:  Blood pressure 124/92, pulse 101, temperature 98 F (36.7 C), temperature source Oral, resp. rate 19, height 6' (1.829 m), weight 213 lb 4.8 oz (96.752 kg), SpO2 99 %.    HEENT: ? Early thrush at the left pharynx Resp: Lungs clear bilaterally Cardio: Regular rate and rhythm GI: The liver is palpable throughout the right upper abdomen Vascular: 1+ pitting edema below the knee bilaterally   Medications: I have reviewed the patient's current medications.  Assessment/Plan: 1. Metastatic carcinoid tumor, status post treatment at [facility] on [facility] study with progressive disease noted on a restaging CT 06/25/2008. He is status post SIR-Spheres therapy at [facility] in December 2009. A restaging CT 03/22/2009 confirmed a decrease in the metastatic liver lesions compared to the November 2009 CT.  1. Restaging CT 09/04/2009 at [facility] confirmed progressive disease in the liver. 2. Initiation of sorafenib therapy February 2011.  3. Restaging CT June 2011 with progressive disease in the liver. 4. Initiation of Avastin/Xeloda June 2011  5. Initiation of Nexavar 08/14/2010 after a CT of the chest, abdomen and pelvis confirmed enlargement of liver lesions and a mesenteric mass. 6. Restaging CT 04/14/2011 showed enlargement hepatic masses and a minimal enlargement of the mesenteric  mass 7. Right hepatic artery embolization with drug-eluting beads 05/15/2011 8. Left lateral hepatic artery embolization with drug-eluting beads (doxorubicin) 06/15/2011 9. Restaging CT 09/07/2011 showed an increase in the size of several hepatic lesion and a decrease in other lesions. The mesenteric mass was slightly larger 10. Restaging CT at [facility] on 12/07/2011 reveals stable hepatic metastases 11. Restaging CT at [facility] on 06/13/2012 reveal overall improvement in the multiple hypoattenuating liver lesions with 2 hyperattenuating lesions felt to be new versus increased in conspicuity do to differences in phase enhancement 12. Restaging CT at [facility] on 12/12/2012-no significant change in the metastatic tumor burden, minimal increase in the calcified mesenteric mass 13. Restaging CT at [facility] 10/24/2013 with progressive liver metastases and chest/abdominal lymphadenopathy, markedly elevated chromogranin 10/24/2013 14. Chemoembolization of the right hepatic artery 12/19/2013 15. Chemoembolization of the left hepatic artery 01/29/2014 16. Initiation of the fosbretabulin study drug at [facility] on 06/19/2014 2. Carcinoid syndrome,controlled with monthly Sandostatin. 3. Pain secondary to metastatic disease involving the liver 4. Hypertension. 5. Hypercholesterolemia 6. History of Facial flushing and a "hot" sensation, potentially related to niacin. 7. History of Helicobacter pylori gastritis. 8. History of rectal bleeding. 9. Diabetes-on insulin and metformin 10. Lumbar osteomyelitis in December 2008. 11. Status post resection of a right lung lesion in April 2010 with pathology revealing only a microscopic focus of carcinoid tumor in the submitted tissue.  12. Erectile dysfunction 13. Admission with chest pain 10/11/2013-negative cardiac evaluation 14. Weight loss secondary to progressive carcinoid tumor 15. Admission to [facility] with sepsis syndrome 08/06/2013-diagnosed with "prostatitis  ".  Disposition:  Frank Pennington has advanced stage carcinoid tumor. His overall condition appears unchanged today. He will continue close  follow-up with Frank Pennington while on study at [facility]. I am available to see him here as needed. He will return for a scheduled office visit in 2 months.  Frank Coder, MD  08/13/2014  9:26 AM

## 2014-08-13 NOTE — Telephone Encounter (Signed)
Gave avs & cal for March. °

## 2014-10-15 ENCOUNTER — Ambulatory Visit (HOSPITAL_BASED_OUTPATIENT_CLINIC_OR_DEPARTMENT_OTHER): Payer: BLUE CROSS/BLUE SHIELD | Admitting: Oncology

## 2014-10-15 ENCOUNTER — Telehealth: Payer: Self-pay | Admitting: Oncology

## 2014-10-15 VITALS — BP 116/78 | HR 91 | Temp 98.0°F | Resp 19 | Ht 72.0 in | Wt 193.4 lb

## 2014-10-15 DIAGNOSIS — E340 Carcinoid syndrome: Secondary | ICD-10-CM | POA: Diagnosis not present

## 2014-10-15 DIAGNOSIS — R109 Unspecified abdominal pain: Secondary | ICD-10-CM

## 2014-10-15 NOTE — Progress Notes (Signed)
[place] OFFICE PROGRESS NOTE   Diagnosis: Carcinoid tumor  INTERVAL HISTORY:   [name] returns as scheduled. He continues to have abdominal pain. He is currently taking OxyContin and oxycodone for pain. He would like to switch back to [facility] with the next medication refill as this is cheaper for him. His appetite has improved recently. He continues to have intermittent diarrhea while maintained on monthly Sandostatin. He underwent a restaging CT evaluation at [facility] on 10/09/2014. Multiple new masses were noted in the right liver. New ascites with evidence of carcinomatosis. He saw Dr. Leamon Arnt and has been taken off of the fosbretabulin study secondary to disease progression. He reports being scheduled for an octreotide scan this week.   Objective:  Vital signs in last 24 hours:  Blood pressure 116/78, pulse 91, temperature 98 F (36.7 C), temperature source Oral, resp. rate 19, height 6' (1.829 m), weight 193 lb 6.4 oz (87.726 kg), SpO2 99 %.   Resp: Lungs clear bilaterally Cardio: Regular rate and rhythm GI: The liver is palpable throughout the right upper abdomen extending across the midline Vascular: No leg edema  Skin: Erythematous rash at the lower back, upper anterior chest, and arms    - Medications: I have reviewed the patient's current medications.  Assessment/Plan: 1. Metastatic carcinoid tumor, status post treatment at [facility] on [facility] study with progressive disease noted on a restaging CT 06/25/2008. He is status post SIR-Spheres therapy at [facility] in December 2009. A restaging CT 03/22/2009 confirmed a decrease in the metastatic liver lesions compared to the November 2009 CT.  1. Restaging CT 09/04/2009 at [facility] confirmed progressive disease in the liver. 2. Initiation of sorafenib therapy February 2011.  3. Restaging CT June 2011 with progressive disease in the liver. 4. Initiation of Avastin/Xeloda June 2011  5. Initiation of Nexavar  08/14/2010 after a CT of the chest, abdomen and pelvis confirmed enlargement of liver lesions and a mesenteric mass. 6. Restaging CT 04/14/2011 showed enlargement hepatic masses and a minimal enlargement of the mesenteric mass 7. Right hepatic artery embolization with drug-eluting beads 05/15/2011 8. Left lateral hepatic artery embolization with drug-eluting beads (doxorubicin) 06/15/2011 9. Restaging CT 09/07/2011 showed an increase in the size of several hepatic lesion and a decrease in other lesions. The mesenteric mass was slightly larger 10. Restaging CT at [facility] on 12/07/2011 reveals stable hepatic metastases 11. Restaging CT at [facility] on 06/13/2012 reveal overall improvement in the multiple hypoattenuating liver lesions with 2 hyperattenuating lesions felt to be new versus increased in conspicuity do to differences in phase enhancement 12. Restaging CT at [facility] on 12/12/2012-no significant change in the metastatic tumor burden, minimal increase in the calcified mesenteric mass 13. Restaging CT at [facility] 10/24/2013 with progressive liver metastases and chest/abdominal lymphadenopathy, markedly elevated chromogranin 10/24/2013 14. Chemoembolization of the right hepatic artery 12/19/2013 15. Chemoembolization of the left hepatic artery 01/29/2014 16. Initiation of the fosbretabulin study drug at [facility] on 06/19/2014 2. Carcinoid syndrome,controlled with monthly Sandostatin. 3. Pain secondary to metastatic disease involving the liver 4. Hypertension. 5. Hypercholesterolemia 6. History of Facial flushing and a "hot" sensation, potentially related to niacin. 7. History of Helicobacter pylori gastritis. 8. History of rectal bleeding. 9. Diabetes-on insulin and metformin 10. Lumbar osteomyelitis in December 2008. 11. Status post resection of a right lung lesion in April 2010 with pathology revealing only a microscopic focus of carcinoid tumor in the submitted tissue.  12. Erectile  dysfunction 13. Admission with chest pain 10/11/2013-negative cardiac evaluation  14. Weight loss secondary to progressive carcinoid tumor 15. Admission to [facility] with sepsis syndrome 08/06/2013-diagnosed with "prostatitis ".   Disposition:  He continues to have abdominal pain. He appears wasted today. The recent CT indicates disease progression. He plans to undergo an octreotide scan to see if he will qualify for radionucleotide therapy. Dr. Leamon Arnt will consider chemotherapy options if he is not a radionucleotide candidate. I told him we could deliver the chemotherapy here  if that is the treatment plan. I will also be glad to refill his pain medications. He will return for an office visit and further discussion in approximately 3 weeks.  Betsy Coder, MD  10/15/2014  2:07 PM

## 2014-10-15 NOTE — Telephone Encounter (Signed)
Pt confirmed labs/ov per 03/14 POF, gave pt AVS.... KJ °

## 2014-11-05 ENCOUNTER — Ambulatory Visit: Payer: Medicare Other | Admitting: Oncology

## 2015-01-02 DEATH — deceased

## 2015-01-28 ENCOUNTER — Other Ambulatory Visit: Payer: Self-pay

## 2015-02-25 IMAGING — CR DG CHEST 1V PORT
1 series · 1 of 1 positions shown · non-contrast
Comparison: none

[AP]
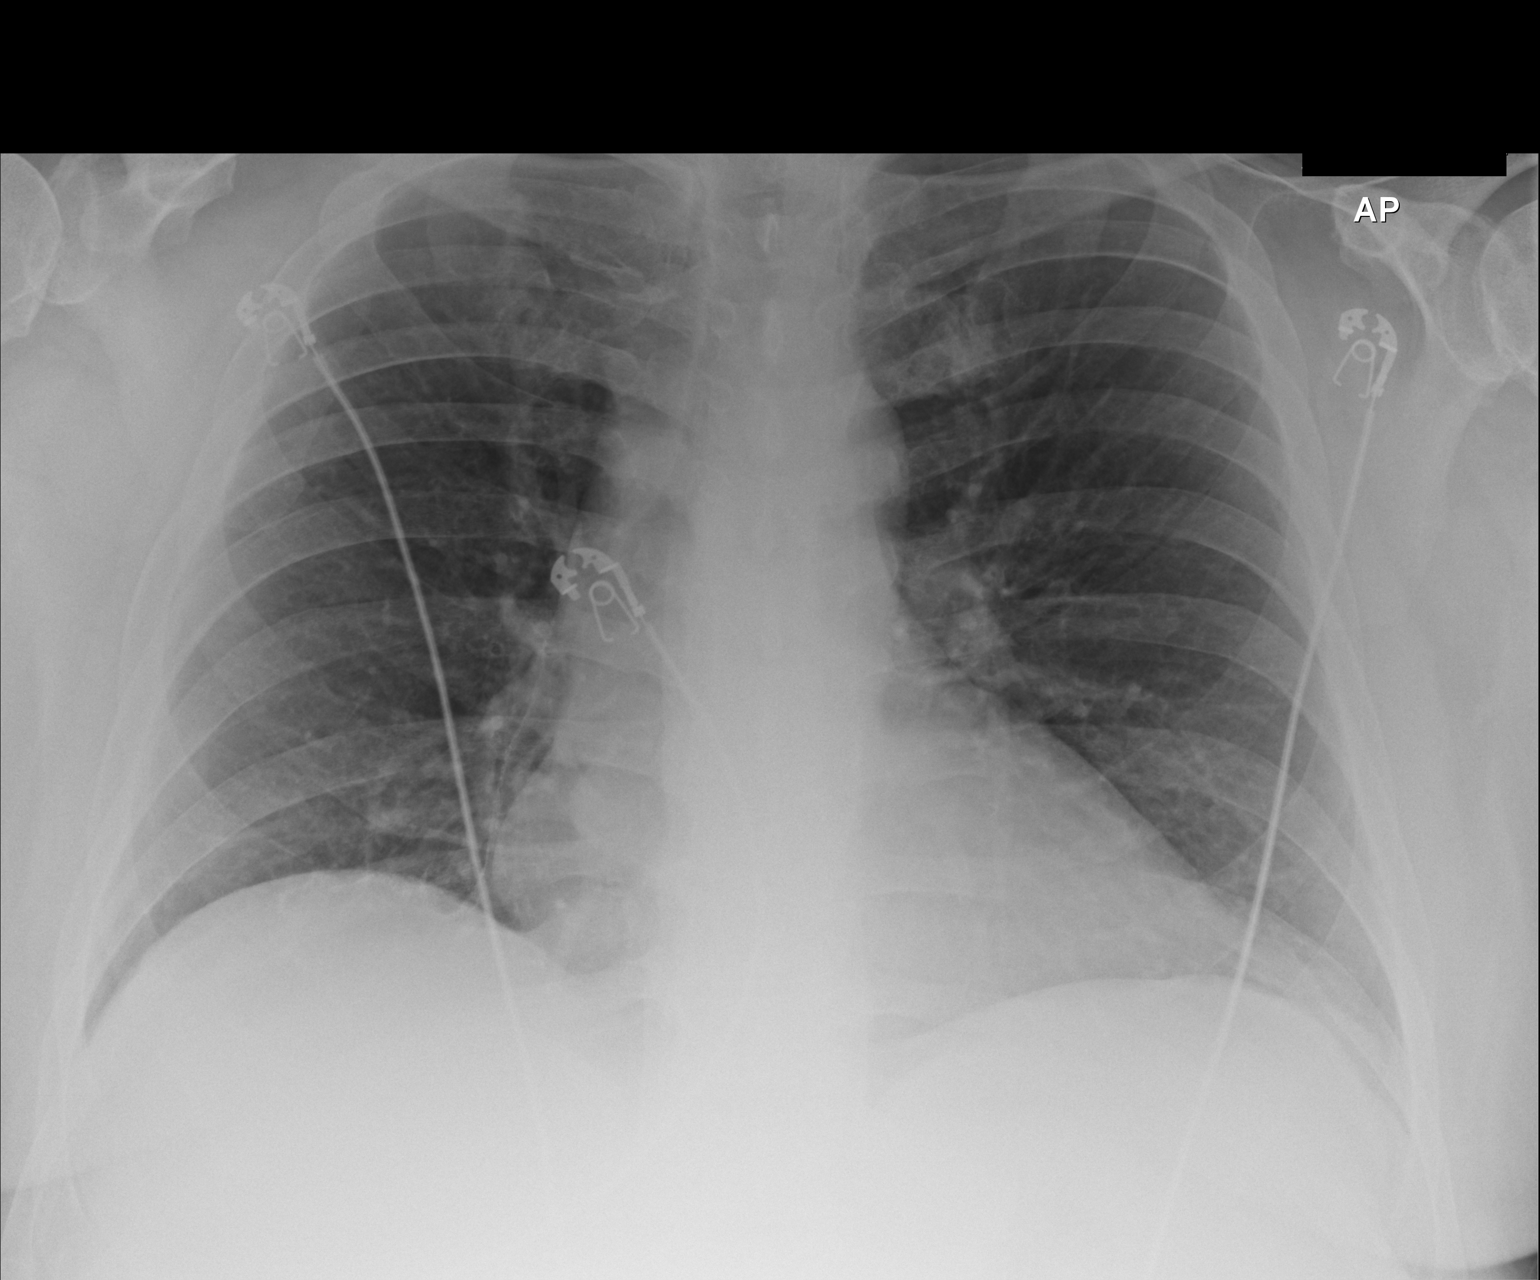

[1 of 1 positions shown; findings below may reference images not displayed]

CLINICAL DATA
Chest pain

EXAM
PORTABLE CHEST - 1 VIEW

COMPARISON
07/29/2006

FINDINGS
Heart size is upper normal. Negative for heart failure. Lungs are
clear without evidence of pneumonia or heart failure. No effusion.

IMPRESSION
No active disease.

SIGNATURE

## 2024-02-18 ENCOUNTER — Encounter: Payer: Self-pay | Admitting: Advanced Practice Midwife
